# Patient Record
Sex: Female | Born: 1945
Health system: Southern US, Community
[De-identification: ages and names within clinical notes are randomized; demographics above are authoritative.]

## PROBLEM LIST (undated history)

## (undated) DIAGNOSIS — I5189 Other ill-defined heart diseases: Secondary | ICD-10-CM

## (undated) DIAGNOSIS — R911 Solitary pulmonary nodule: Secondary | ICD-10-CM

## (undated) DIAGNOSIS — I671 Cerebral aneurysm, nonruptured: Secondary | ICD-10-CM

## (undated) DIAGNOSIS — E538 Deficiency of other specified B group vitamins: Secondary | ICD-10-CM

## (undated) DIAGNOSIS — E785 Hyperlipidemia, unspecified: Secondary | ICD-10-CM

## (undated) DIAGNOSIS — I340 Nonrheumatic mitral (valve) insufficiency: Secondary | ICD-10-CM

## (undated) DIAGNOSIS — I10 Essential (primary) hypertension: Secondary | ICD-10-CM

## (undated) DIAGNOSIS — C4490 Unspecified malignant neoplasm of skin, unspecified: Secondary | ICD-10-CM

## (undated) DIAGNOSIS — I639 Cerebral infarction, unspecified: Secondary | ICD-10-CM

## (undated) DIAGNOSIS — I712 Thoracic aortic aneurysm, without rupture, unspecified: Secondary | ICD-10-CM

## (undated) DIAGNOSIS — I700 Atherosclerosis of aorta: Secondary | ICD-10-CM

## (undated) DIAGNOSIS — I351 Nonrheumatic aortic (valve) insufficiency: Secondary | ICD-10-CM

## (undated) DIAGNOSIS — I2510 Atherosclerotic heart disease of native coronary artery without angina pectoris: Secondary | ICD-10-CM

## (undated) DIAGNOSIS — E559 Vitamin D deficiency, unspecified: Secondary | ICD-10-CM

## (undated) DIAGNOSIS — N390 Urinary tract infection, site not specified: Secondary | ICD-10-CM

## (undated) DIAGNOSIS — C801 Malignant (primary) neoplasm, unspecified: Secondary | ICD-10-CM

## (undated) DIAGNOSIS — M810 Age-related osteoporosis without current pathological fracture: Secondary | ICD-10-CM

## (undated) DIAGNOSIS — I619 Nontraumatic intracerebral hemorrhage, unspecified: Secondary | ICD-10-CM

## (undated) HISTORY — PX: CHOLECYSTECTOMY: SHX55

## (undated) HISTORY — PX: COLONOSCOPY: SHX174

## (undated) HISTORY — PX: BREAST EXCISIONAL BIOPSY: SUR124

## (undated) HISTORY — PX: ABDOMINAL HYSTERECTOMY: SHX81

---

## 2005-09-21 ENCOUNTER — Ambulatory Visit: Payer: Self-pay

## 2005-09-23 ENCOUNTER — Ambulatory Visit: Payer: Self-pay

## 2006-09-21 ENCOUNTER — Ambulatory Visit: Payer: Self-pay

## 2007-10-03 ENCOUNTER — Ambulatory Visit: Payer: Self-pay

## 2007-10-06 ENCOUNTER — Other Ambulatory Visit: Payer: Self-pay

## 2007-10-06 ENCOUNTER — Ambulatory Visit: Payer: Self-pay | Admitting: Surgery

## 2007-10-12 ENCOUNTER — Ambulatory Visit: Payer: Self-pay | Admitting: Surgery

## 2007-10-12 HISTORY — PX: BREAST BIOPSY: SHX20

## 2008-09-03 ENCOUNTER — Ambulatory Visit: Payer: Self-pay | Admitting: Internal Medicine

## 2008-12-03 ENCOUNTER — Ambulatory Visit: Payer: Self-pay

## 2010-01-21 ENCOUNTER — Ambulatory Visit: Payer: Self-pay

## 2011-01-29 ENCOUNTER — Ambulatory Visit: Payer: Self-pay

## 2011-02-17 ENCOUNTER — Ambulatory Visit: Payer: Self-pay

## 2012-11-14 ENCOUNTER — Ambulatory Visit: Payer: Self-pay | Admitting: Family Medicine

## 2012-12-18 ENCOUNTER — Ambulatory Visit: Payer: Self-pay | Admitting: Gastroenterology

## 2014-01-03 ENCOUNTER — Ambulatory Visit: Payer: Self-pay | Admitting: Family Medicine

## 2014-04-27 ENCOUNTER — Ambulatory Visit: Payer: Self-pay | Admitting: Physician Assistant

## 2014-04-27 LAB — URINALYSIS, COMPLETE
Bilirubin,UR: NEGATIVE
GLUCOSE, UR: NEGATIVE
Ketone: NEGATIVE
NITRITE: NEGATIVE
PH: 7.5 (ref 5.0–8.0)
Protein: NEGATIVE
SPECIFIC GRAVITY: 1.01 (ref 1.000–1.030)
WBC UR: >30 /HPF (ref 0–5)

## 2014-04-28 LAB — URINE CULTURE

## 2014-12-10 ENCOUNTER — Other Ambulatory Visit: Payer: Self-pay | Admitting: Family Medicine

## 2014-12-10 DIAGNOSIS — Z1231 Encounter for screening mammogram for malignant neoplasm of breast: Secondary | ICD-10-CM

## 2015-01-07 ENCOUNTER — Ambulatory Visit
Admission: RE | Admit: 2015-01-07 | Discharge: 2015-01-07 | Disposition: A | Discharge status: Home / Self Care | Payer: PPO | Source: Ambulatory Visit | Attending: Family Medicine | Admitting: Family Medicine

## 2015-01-07 DIAGNOSIS — Z1231 Encounter for screening mammogram for malignant neoplasm of breast: Secondary | ICD-10-CM | POA: Diagnosis present

## 2015-01-07 HISTORY — DX: Malignant (primary) neoplasm, unspecified: C80.1

## 2015-07-15 DIAGNOSIS — N390 Urinary tract infection, site not specified: Secondary | ICD-10-CM | POA: Diagnosis not present

## 2015-07-15 DIAGNOSIS — R300 Dysuria: Secondary | ICD-10-CM | POA: Diagnosis not present

## 2015-12-12 DIAGNOSIS — R030 Elevated blood-pressure reading, without diagnosis of hypertension: Secondary | ICD-10-CM | POA: Diagnosis not present

## 2015-12-12 DIAGNOSIS — Z0000 Encounter for general adult medical examination without abnormal findings: Secondary | ICD-10-CM | POA: Diagnosis not present

## 2015-12-15 ENCOUNTER — Other Ambulatory Visit: Payer: Self-pay | Admitting: Family Medicine

## 2015-12-15 DIAGNOSIS — Z1231 Encounter for screening mammogram for malignant neoplasm of breast: Secondary | ICD-10-CM

## 2016-01-08 ENCOUNTER — Ambulatory Visit
Admission: RE | Admit: 2016-01-08 | Discharge: 2016-01-08 | Disposition: A | Discharge status: Home / Self Care | Payer: PPO | Source: Ambulatory Visit | Attending: Family Medicine | Admitting: Family Medicine

## 2016-01-08 DIAGNOSIS — Z1231 Encounter for screening mammogram for malignant neoplasm of breast: Secondary | ICD-10-CM | POA: Diagnosis not present

## 2016-12-14 ENCOUNTER — Other Ambulatory Visit: Payer: Self-pay | Admitting: Family Medicine

## 2016-12-14 DIAGNOSIS — Z79899 Other long term (current) drug therapy: Secondary | ICD-10-CM | POA: Diagnosis not present

## 2016-12-14 DIAGNOSIS — Z1231 Encounter for screening mammogram for malignant neoplasm of breast: Secondary | ICD-10-CM

## 2016-12-14 DIAGNOSIS — Z23 Encounter for immunization: Secondary | ICD-10-CM | POA: Diagnosis not present

## 2016-12-14 DIAGNOSIS — E785 Hyperlipidemia, unspecified: Secondary | ICD-10-CM | POA: Diagnosis not present

## 2016-12-14 DIAGNOSIS — Z1159 Encounter for screening for other viral diseases: Secondary | ICD-10-CM | POA: Diagnosis not present

## 2016-12-14 DIAGNOSIS — Z0000 Encounter for general adult medical examination without abnormal findings: Secondary | ICD-10-CM | POA: Diagnosis not present

## 2017-01-10 ENCOUNTER — Ambulatory Visit: Payer: PPO

## 2017-02-03 ENCOUNTER — Ambulatory Visit
Admission: RE | Admit: 2017-02-03 | Discharge: 2017-02-03 | Disposition: A | Discharge status: Home / Self Care | Payer: PPO | Source: Ambulatory Visit | Attending: Family Medicine | Admitting: Family Medicine

## 2017-02-03 DIAGNOSIS — Z1231 Encounter for screening mammogram for malignant neoplasm of breast: Secondary | ICD-10-CM | POA: Insufficient documentation

## 2018-04-04 ENCOUNTER — Other Ambulatory Visit: Payer: Self-pay | Admitting: Gerontology

## 2018-04-04 DIAGNOSIS — Z1231 Encounter for screening mammogram for malignant neoplasm of breast: Secondary | ICD-10-CM

## 2018-04-04 DIAGNOSIS — E785 Hyperlipidemia, unspecified: Secondary | ICD-10-CM | POA: Diagnosis not present

## 2018-04-04 DIAGNOSIS — Z0000 Encounter for general adult medical examination without abnormal findings: Secondary | ICD-10-CM | POA: Diagnosis not present

## 2018-04-04 DIAGNOSIS — R030 Elevated blood-pressure reading, without diagnosis of hypertension: Secondary | ICD-10-CM | POA: Diagnosis not present

## 2018-04-04 DIAGNOSIS — E538 Deficiency of other specified B group vitamins: Secondary | ICD-10-CM | POA: Diagnosis not present

## 2018-04-13 ENCOUNTER — Other Ambulatory Visit: Payer: Self-pay | Admitting: Gerontology

## 2018-04-13 DIAGNOSIS — Z1231 Encounter for screening mammogram for malignant neoplasm of breast: Secondary | ICD-10-CM

## 2018-04-19 ENCOUNTER — Ambulatory Visit
Admission: RE | Admit: 2018-04-19 | Discharge: 2018-04-19 | Disposition: A | Discharge status: Home / Self Care | Payer: PPO | Source: Ambulatory Visit | Attending: Family Medicine | Admitting: Family Medicine

## 2018-04-19 ENCOUNTER — Other Ambulatory Visit: Payer: Self-pay

## 2018-04-19 DIAGNOSIS — Z1231 Encounter for screening mammogram for malignant neoplasm of breast: Secondary | ICD-10-CM | POA: Diagnosis not present

## 2018-09-27 DIAGNOSIS — M810 Age-related osteoporosis without current pathological fracture: Secondary | ICD-10-CM | POA: Diagnosis not present

## 2018-10-04 DIAGNOSIS — E785 Hyperlipidemia, unspecified: Secondary | ICD-10-CM | POA: Diagnosis not present

## 2018-10-04 DIAGNOSIS — R030 Elevated blood-pressure reading, without diagnosis of hypertension: Secondary | ICD-10-CM | POA: Diagnosis not present

## 2018-10-04 DIAGNOSIS — E559 Vitamin D deficiency, unspecified: Secondary | ICD-10-CM | POA: Diagnosis not present

## 2018-10-04 DIAGNOSIS — Z1329 Encounter for screening for other suspected endocrine disorder: Secondary | ICD-10-CM | POA: Diagnosis not present

## 2018-10-04 DIAGNOSIS — E538 Deficiency of other specified B group vitamins: Secondary | ICD-10-CM | POA: Diagnosis not present

## 2018-10-04 DIAGNOSIS — M810 Age-related osteoporosis without current pathological fracture: Secondary | ICD-10-CM | POA: Diagnosis not present

## 2018-10-04 DIAGNOSIS — Z0000 Encounter for general adult medical examination without abnormal findings: Secondary | ICD-10-CM | POA: Diagnosis not present

## 2018-10-04 DIAGNOSIS — Z131 Encounter for screening for diabetes mellitus: Secondary | ICD-10-CM | POA: Diagnosis not present

## 2018-10-04 DIAGNOSIS — Z7189 Other specified counseling: Secondary | ICD-10-CM | POA: Diagnosis not present

## 2019-04-03 ENCOUNTER — Other Ambulatory Visit: Payer: Self-pay | Admitting: Gerontology

## 2019-04-03 DIAGNOSIS — Z1239 Encounter for other screening for malignant neoplasm of breast: Secondary | ICD-10-CM | POA: Diagnosis not present

## 2019-04-03 DIAGNOSIS — M810 Age-related osteoporosis without current pathological fracture: Secondary | ICD-10-CM | POA: Diagnosis not present

## 2019-04-03 DIAGNOSIS — R030 Elevated blood-pressure reading, without diagnosis of hypertension: Secondary | ICD-10-CM | POA: Diagnosis not present

## 2019-04-03 DIAGNOSIS — Z79899 Other long term (current) drug therapy: Secondary | ICD-10-CM | POA: Diagnosis not present

## 2019-04-03 DIAGNOSIS — E559 Vitamin D deficiency, unspecified: Secondary | ICD-10-CM | POA: Diagnosis not present

## 2019-04-03 DIAGNOSIS — Z7189 Other specified counseling: Secondary | ICD-10-CM | POA: Diagnosis not present

## 2019-04-03 DIAGNOSIS — E538 Deficiency of other specified B group vitamins: Secondary | ICD-10-CM | POA: Diagnosis not present

## 2019-04-03 DIAGNOSIS — E785 Hyperlipidemia, unspecified: Secondary | ICD-10-CM | POA: Diagnosis not present

## 2019-04-03 DIAGNOSIS — Z1231 Encounter for screening mammogram for malignant neoplasm of breast: Secondary | ICD-10-CM

## 2019-06-05 ENCOUNTER — Other Ambulatory Visit: Payer: Self-pay

## 2019-06-05 ENCOUNTER — Ambulatory Visit
Admission: RE | Admit: 2019-06-05 | Discharge: 2019-06-05 | Disposition: A | Discharge status: Home / Self Care | Payer: PPO | Source: Ambulatory Visit | Attending: Gerontology | Admitting: Gerontology

## 2019-06-05 DIAGNOSIS — Z1231 Encounter for screening mammogram for malignant neoplasm of breast: Secondary | ICD-10-CM | POA: Insufficient documentation

## 2019-10-02 DIAGNOSIS — R399 Unspecified symptoms and signs involving the genitourinary system: Secondary | ICD-10-CM | POA: Diagnosis not present

## 2019-10-03 ENCOUNTER — Ambulatory Visit
Admission: EM | Admit: 2019-10-03 | Discharge: 2019-10-03 | Disposition: A | Discharge status: Home / Self Care | Payer: PPO | Attending: Emergency Medicine | Admitting: Emergency Medicine

## 2019-10-03 ENCOUNTER — Other Ambulatory Visit: Payer: Self-pay

## 2019-10-03 DIAGNOSIS — N390 Urinary tract infection, site not specified: Secondary | ICD-10-CM

## 2019-10-03 DIAGNOSIS — R319 Hematuria, unspecified: Secondary | ICD-10-CM

## 2019-10-03 LAB — URINALYSIS, COMPLETE (UACMP) WITH MICROSCOPIC
Bilirubin Urine: NEGATIVE
Glucose, UA: NEGATIVE mg/dL
Ketones, ur: NEGATIVE mg/dL
Nitrite: NEGATIVE
Protein, ur: >300 mg/dL — AB
RBC / HPF: >50 RBC/hpf (ref 0–5)
Specific Gravity, Urine: 1.015 (ref 1.005–1.030)
WBC, UA: >50 WBC/hpf (ref 0–5)
pH: 5.5 (ref 5.0–8.0)

## 2019-10-03 MED ORDER — PHENAZOPYRIDINE HCL 200 MG PO TABS: 200.0000 mg | ORAL_TABLET | Freq: Two times a day (BID) | ORAL | 0 refills | Status: DC | PRN

## 2019-10-03 MED ORDER — CEPHALEXIN 500 MG PO CAPS: 500.0000 mg | ORAL_CAPSULE | Freq: Two times a day (BID) | ORAL | 0 refills | Status: DC

## 2019-10-03 NOTE — Discharge Instructions (Addendum)
Take the Pyridium as needed for symptom relief. Continue pushing fluids. Finish the Keflex, even if you feel better. We will call you if we need to change your antibiotics.

## 2019-10-03 NOTE — ED Provider Notes (Signed)
HPI  SUBJECTIVE:  Paige Griffin is a 74 y.o. female who presents with 2 days of dysuria, urgency, frequency, cloudy urine. No odorous urine, hematuria. No vomiting, fevers, back, abdominal, pelvic pain. No vaginal bleeding, odor, discharge, rash. States that she has not been sexually active in years. No antibiotics in the past month. No antipyretic in the past 6 hours. She tried pushing water without improvement in her symptoms. Symptoms worse with urination. She has a past medical history of UTIs and states this feels similar to that. No history of pyelonephritis, nephrolithiasis. Also history of hypertension. No history of diabetes, chronic kidney disease. SWN:IOEVO, Larene Beach, NP     Past Medical History:  Diagnosis Date  . Cancer ([place])    skin ca    Past Surgical History:  Procedure Laterality Date  . ABDOMINAL HYSTERECTOMY    . BREAST BIOPSY Right 10/12/07   neg    Family History  Problem Relation Age of Onset  . Breast cancer Other 67    Social History   Tobacco Use  . Smoking status: Never Smoker  . Smokeless tobacco: Never Used  Substance Use Topics  . Alcohol use: Never  . Drug use: Not on file    No current facility-administered medications for this encounter.  Current Outpatient Medications:  .  alendronate (FOSAMAX) 70 MG tablet, Take 70 mg by mouth once a week., Disp: , Rfl:  .  Cholecalciferol 50 MCG (2000 UT) CAPS, Take by mouth., Disp: , Rfl:  .  cyanocobalamin 1000 MCG tablet, Take 2 tablets daily for 2 weeks, then reduce to 1 tablet daily thereafter for Vitamin B12 Deficiency., Disp: , Rfl:  .  losartan (COZAAR) 25 MG tablet, Take 25 mg by mouth daily., Disp: , Rfl:  .  pravastatin (PRAVACHOL) 20 MG tablet, Take 20 mg by mouth at bedtime., Disp: , Rfl:  .  cephALEXin (KEFLEX) 500 MG capsule, Take 1 capsule (500 mg total) by mouth 2 (two) times daily., Disp: 14 capsule, Rfl: 0 .  phenazopyridine (PYRIDIUM) 200 MG tablet, Take 1 tablet (200 mg total)  by mouth 2 (two) times daily as needed for pain., Disp: 4 tablet, Rfl: 0  No Known Allergies   ROS  As noted in HPI.   Physical Exam  BP 136/84 (BP Location: Right Arm)   Pulse 74   Temp 97.7 F (36.5 C) (Oral)   Resp 16   Ht 5' 4.5" (1.638 m)   Wt 54.4 kg   SpO2 96%   BMI 20.28 kg/m   Constitutional: Well developed, well nourished, no acute distress Eyes:  EOMI, conjunctiva normal bilaterally HENT: Normocephalic, atraumatic,mucus membranes moist Respiratory: Normal inspiratory effort Cardiovascular: Normal rate GI: nondistended no suprapubic, flank tenderness Back: No CVAT skin: No rash, skin intact Musculoskeletal: no deformities Neurologic: Alert & oriented x 3, no focal neuro deficits Psychiatric: Speech and behavior appropriate   ED Course   Medications - No data to display  Orders Placed This Encounter  Procedures  . Urine culture    Standing Status:   Standing    Number of Occurrences:   1  . Urinalysis, Complete w Microscopic    Standing Status:   Standing    Number of Occurrences:   1    Results for orders placed or performed during the hospital encounter of 10/03/19 (from the past 24 hour(s))  Urinalysis, Complete w Microscopic Urine, Clean Catch     Status: Abnormal   Collection Time: 10/03/19  9:53 AM  Result  Value Ref Range   Color, Urine YELLOW YELLOW   APPearance CLOUDY (A) CLEAR   Specific Gravity, Urine 1.015 1.005 - 1.030   pH 5.5 5.0 - 8.0   Glucose, UA NEGATIVE NEGATIVE mg/dL   Hgb urine dipstick LARGE (A) NEGATIVE   Bilirubin Urine NEGATIVE NEGATIVE   Ketones, ur NEGATIVE NEGATIVE mg/dL   Protein, ur >300 (A) NEGATIVE mg/dL   Nitrite NEGATIVE NEGATIVE   Leukocytes,Ua SMALL (A) NEGATIVE   Squamous Epithelial / LPF 6-10 0 - 5   WBC, UA >50 0 - 5 WBC/hpf   RBC / HPF >50 0 - 5 RBC/hpf   Bacteria, UA FEW (A) NONE SEEN   No results found.  ED Clinical Impression  1. Urinary tract infection with hematuria, site unspecified       ED Assessment/Plan  Calculated creatinine clearance 55  Outside labs, records reviewed. She has a urine culture ordered yesterday. UA showed trace blood, moderate esterase, few white blood cells and bacteria.  Patient absolutely denies any vaginal complaints. Will treat as UTI with renally dosed Pyridium. No need to renally dose Keflex. Urine culture sent.  Discussed labs,MDM, treatment plan, and plan for follow-up with patient. Discussed sn/sx that should prompt return to the ED. patient agrees with plan.   Meds ordered this encounter  Medications  . phenazopyridine (PYRIDIUM) 200 MG tablet    Sig: Take 1 tablet (200 mg total) by mouth 2 (two) times daily as needed for pain.    Dispense:  4 tablet    Refill:  0  . cephALEXin (KEFLEX) 500 MG capsule    Sig: Take 1 capsule (500 mg total) by mouth 2 (two) times daily.    Dispense:  14 capsule    Refill:  0    *This clinic note was created using [employer]. Therefore, there may be occasional mistakes despite careful proofreading.   ?    Melynda Ripple, MD 10/03/19 1041

## 2019-10-03 NOTE — ED Triage Notes (Signed)
Patient in today w/ c/o UTI sx. Patient states sx onset was Sunday night. Patient states burning and decreased urine output but still has urgency. Patient denies flank pain.

## 2019-10-04 DIAGNOSIS — Z131 Encounter for screening for diabetes mellitus: Secondary | ICD-10-CM | POA: Diagnosis not present

## 2019-10-04 DIAGNOSIS — Z0000 Encounter for general adult medical examination without abnormal findings: Secondary | ICD-10-CM | POA: Diagnosis not present

## 2019-10-04 DIAGNOSIS — E785 Hyperlipidemia, unspecified: Secondary | ICD-10-CM | POA: Diagnosis not present

## 2019-10-04 DIAGNOSIS — R030 Elevated blood-pressure reading, without diagnosis of hypertension: Secondary | ICD-10-CM | POA: Diagnosis not present

## 2019-10-04 DIAGNOSIS — E538 Deficiency of other specified B group vitamins: Secondary | ICD-10-CM | POA: Diagnosis not present

## 2019-10-04 DIAGNOSIS — Z1329 Encounter for screening for other suspected endocrine disorder: Secondary | ICD-10-CM | POA: Diagnosis not present

## 2019-10-04 DIAGNOSIS — M810 Age-related osteoporosis without current pathological fracture: Secondary | ICD-10-CM | POA: Diagnosis not present

## 2019-10-04 DIAGNOSIS — E559 Vitamin D deficiency, unspecified: Secondary | ICD-10-CM | POA: Diagnosis not present

## 2019-10-05 ENCOUNTER — Telehealth: Payer: Self-pay

## 2019-10-05 LAB — URINE CULTURE: Culture: >=100000 — AB

## 2019-10-05 MED ORDER — SULFAMETHOXAZOLE-TRIMETHOPRIM 800-160 MG PO TABS: 1.0000 | ORAL_TABLET | Freq: Two times a day (BID) | ORAL | 0 refills | Status: AC

## 2019-12-24 IMAGING — MG DIGITAL SCREENING BILATERAL MAMMOGRAM WITH TOMO AND CAD
8 series · 9 of 24 positions shown · non-contrast
Comparison: Previous exam(s).

CLINICAL DATA: Screening.

EXAM:
DIGITAL SCREENING BILATERAL MAMMOGRAM WITH TOMO AND CAD

[L CC synth-2D]
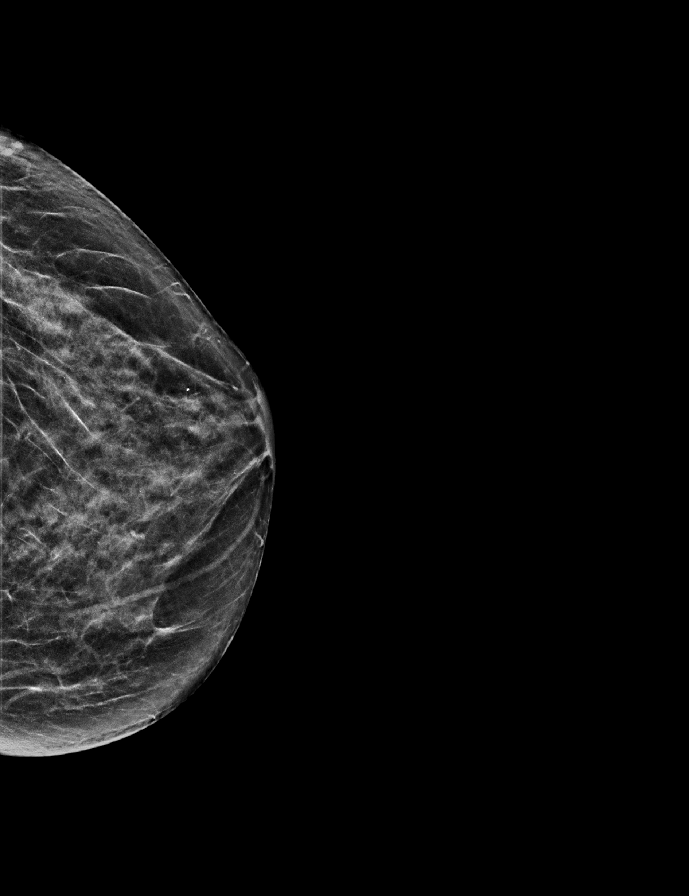

[R CC synth-2D]
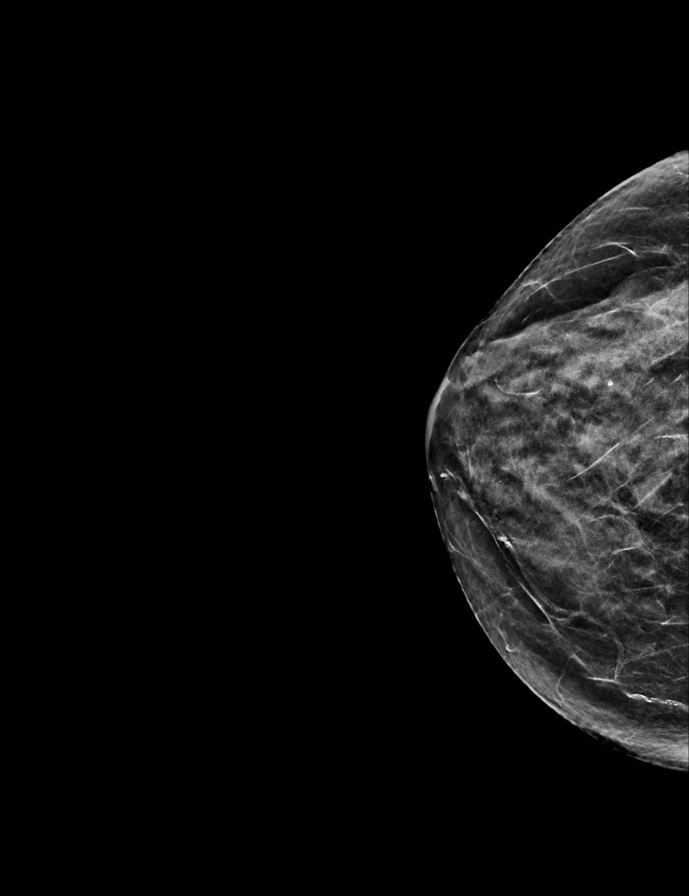

[R MLO synth-2D]
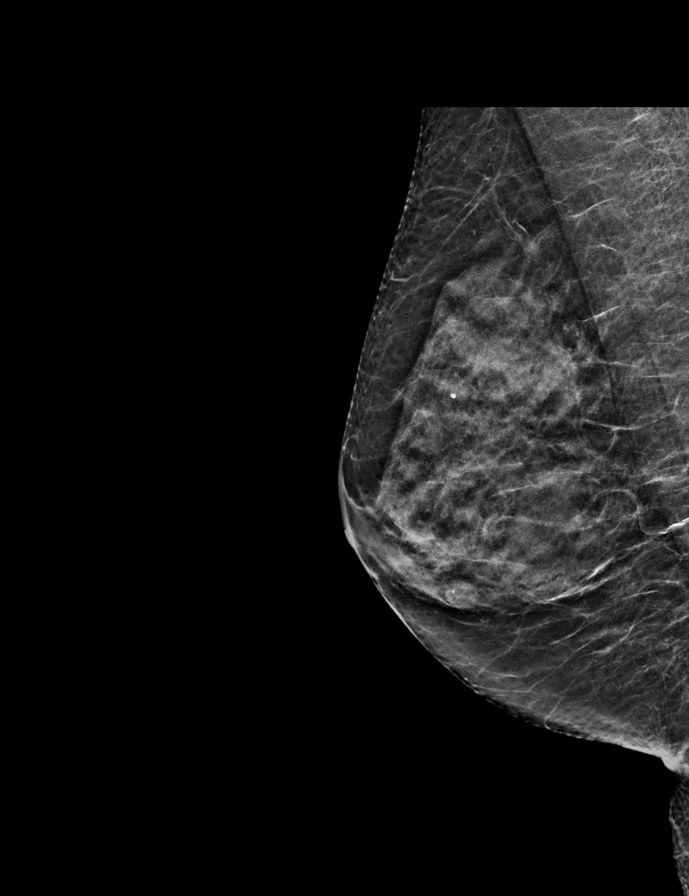

[L MLO synth-2D]
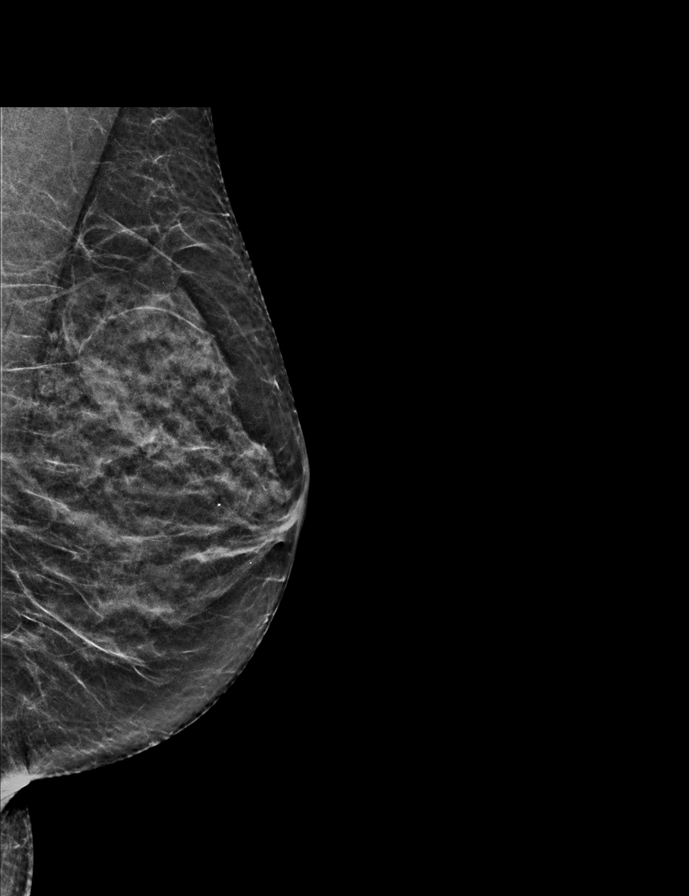

[L CC tomo · 2 of 47 frames shown]
[frame 16/47]
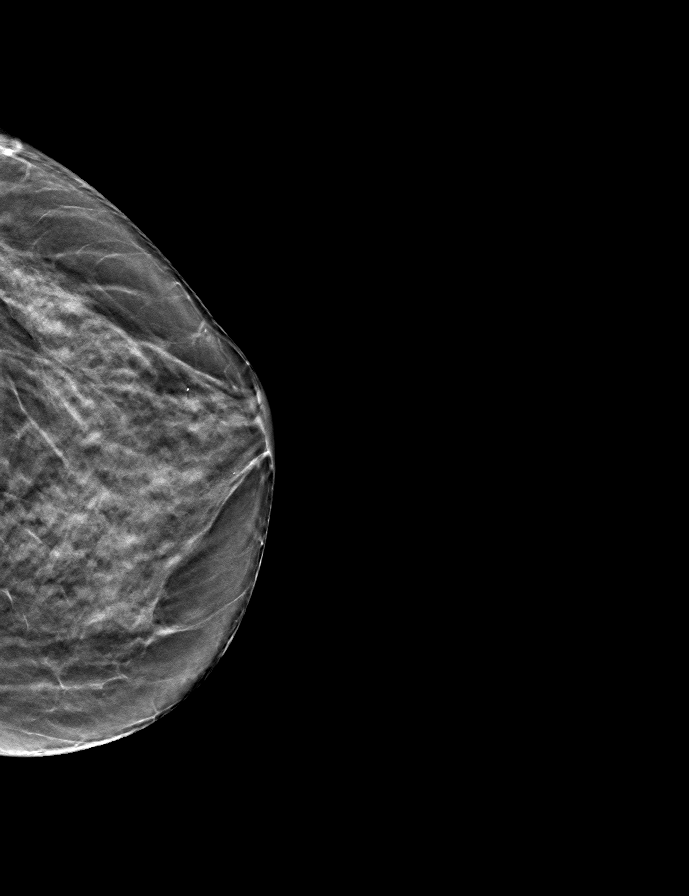
[frame 24/47]
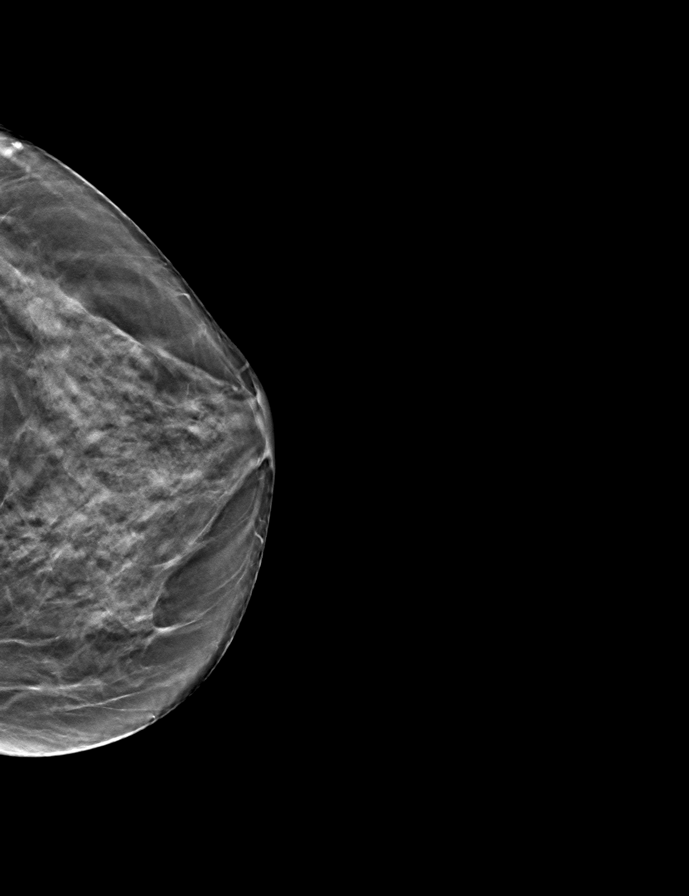

[L MLO tomo · tomo slice 22/43.0]
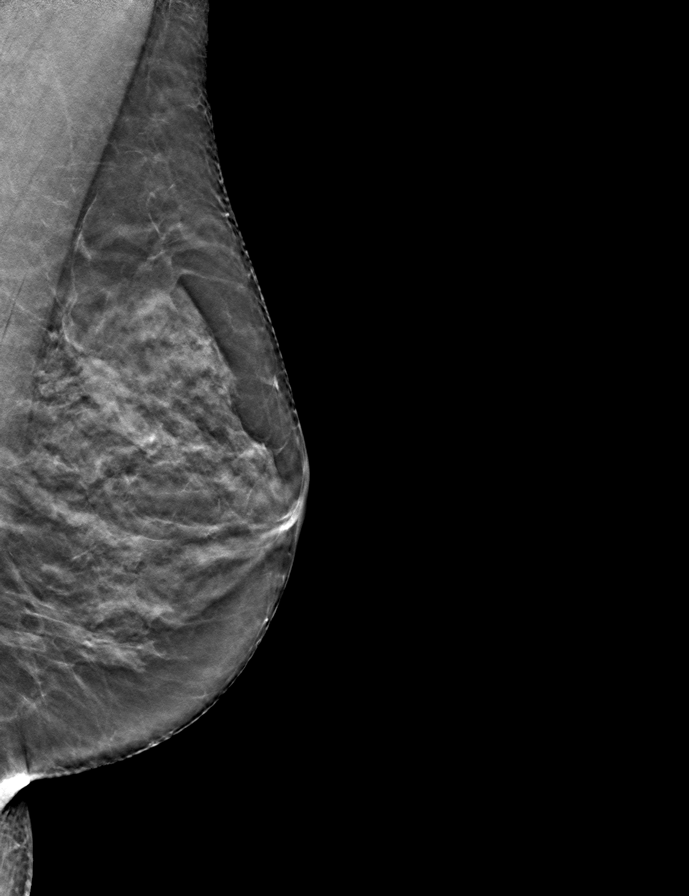

[R CC tomo · tomo slice 25/48.0]
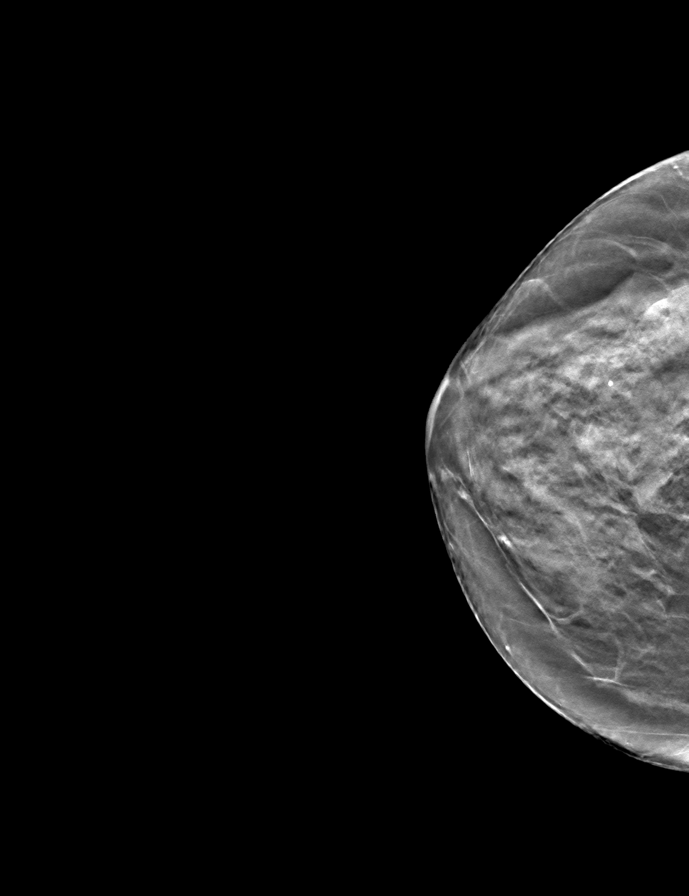

[R MLO tomo · tomo slice 21/42.0]
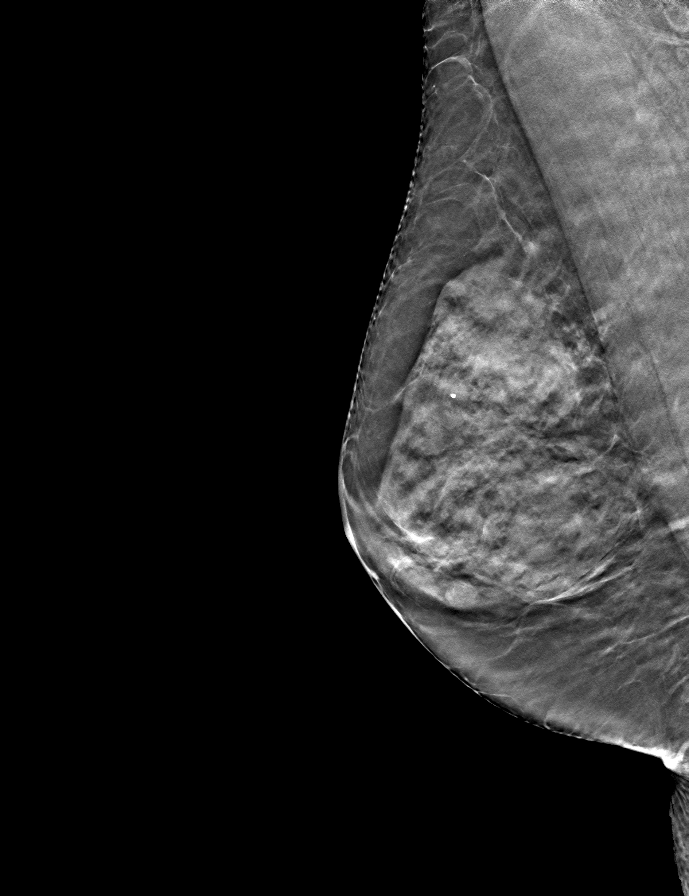

[9 of 24 positions shown; findings below may reference images not displayed]

ACR Breast Density Category c: The breast tissue is heterogeneously
dense, which may obscure small masses.
FINDINGS: There are no findings suspicious for malignancy. Images were
processed with CAD.
IMPRESSION: No mammographic evidence of malignancy. A result letter of this
screening mammogram will be mailed directly to the patient.

RECOMMENDATION:
Screening mammogram in one year. (Code:FT-U-LHB)

BI-RADS CATEGORY  1: Negative.

## 2020-05-26 DIAGNOSIS — R030 Elevated blood-pressure reading, without diagnosis of hypertension: Secondary | ICD-10-CM | POA: Diagnosis not present

## 2020-05-26 DIAGNOSIS — Z1231 Encounter for screening mammogram for malignant neoplasm of breast: Secondary | ICD-10-CM | POA: Diagnosis not present

## 2020-05-26 DIAGNOSIS — E538 Deficiency of other specified B group vitamins: Secondary | ICD-10-CM | POA: Diagnosis not present

## 2020-05-26 DIAGNOSIS — E559 Vitamin D deficiency, unspecified: Secondary | ICD-10-CM | POA: Diagnosis not present

## 2020-05-26 DIAGNOSIS — M810 Age-related osteoporosis without current pathological fracture: Secondary | ICD-10-CM | POA: Diagnosis not present

## 2020-05-26 DIAGNOSIS — E785 Hyperlipidemia, unspecified: Secondary | ICD-10-CM | POA: Diagnosis not present

## 2020-05-27 ENCOUNTER — Other Ambulatory Visit: Payer: Self-pay | Admitting: Gerontology

## 2020-05-27 DIAGNOSIS — Z1231 Encounter for screening mammogram for malignant neoplasm of breast: Secondary | ICD-10-CM

## 2020-06-05 ENCOUNTER — Ambulatory Visit
Admission: RE | Admit: 2020-06-05 | Discharge: 2020-06-05 | Disposition: A | Discharge status: Home / Self Care | Payer: PPO | Source: Ambulatory Visit | Attending: Gerontology | Admitting: Gerontology

## 2020-06-05 ENCOUNTER — Other Ambulatory Visit: Payer: Self-pay

## 2020-06-05 DIAGNOSIS — Z1231 Encounter for screening mammogram for malignant neoplasm of breast: Secondary | ICD-10-CM | POA: Diagnosis not present

## 2020-12-31 DIAGNOSIS — I10 Essential (primary) hypertension: Secondary | ICD-10-CM | POA: Diagnosis not present

## 2020-12-31 DIAGNOSIS — E785 Hyperlipidemia, unspecified: Secondary | ICD-10-CM | POA: Diagnosis not present

## 2020-12-31 DIAGNOSIS — E538 Deficiency of other specified B group vitamins: Secondary | ICD-10-CM | POA: Diagnosis not present

## 2020-12-31 DIAGNOSIS — Z0000 Encounter for general adult medical examination without abnormal findings: Secondary | ICD-10-CM | POA: Diagnosis not present

## 2020-12-31 DIAGNOSIS — Z131 Encounter for screening for diabetes mellitus: Secondary | ICD-10-CM | POA: Diagnosis not present

## 2020-12-31 DIAGNOSIS — Z23 Encounter for immunization: Secondary | ICD-10-CM | POA: Diagnosis not present

## 2020-12-31 DIAGNOSIS — M810 Age-related osteoporosis without current pathological fracture: Secondary | ICD-10-CM | POA: Diagnosis not present

## 2020-12-31 DIAGNOSIS — Z1329 Encounter for screening for other suspected endocrine disorder: Secondary | ICD-10-CM | POA: Diagnosis not present

## 2020-12-31 DIAGNOSIS — E559 Vitamin D deficiency, unspecified: Secondary | ICD-10-CM | POA: Diagnosis not present

## 2021-07-02 ENCOUNTER — Other Ambulatory Visit: Payer: Self-pay | Admitting: Gerontology

## 2021-07-02 DIAGNOSIS — Z1231 Encounter for screening mammogram for malignant neoplasm of breast: Secondary | ICD-10-CM

## 2021-07-06 ENCOUNTER — Ambulatory Visit
Admission: RE | Admit: 2021-07-06 | Discharge: 2021-07-06 | Disposition: A | Discharge status: Home / Self Care | Payer: PPO | Source: Ambulatory Visit | Attending: Gerontology | Admitting: Gerontology

## 2021-07-06 DIAGNOSIS — Z1231 Encounter for screening mammogram for malignant neoplasm of breast: Secondary | ICD-10-CM | POA: Insufficient documentation

## 2022-07-22 ENCOUNTER — Other Ambulatory Visit: Payer: Self-pay | Admitting: Gerontology

## 2022-07-22 DIAGNOSIS — Z1231 Encounter for screening mammogram for malignant neoplasm of breast: Secondary | ICD-10-CM

## 2022-08-11 ENCOUNTER — Ambulatory Visit
Admission: RE | Admit: 2022-08-11 | Discharge: 2022-08-11 | Disposition: A | Discharge status: Home / Self Care | Payer: PPO | Source: Ambulatory Visit | Attending: Gerontology | Admitting: Gerontology

## 2022-08-11 DIAGNOSIS — Z1231 Encounter for screening mammogram for malignant neoplasm of breast: Secondary | ICD-10-CM | POA: Diagnosis present

## 2023-01-02 DIAGNOSIS — I619 Nontraumatic intracerebral hemorrhage, unspecified: Secondary | ICD-10-CM

## 2023-01-02 HISTORY — DX: Nontraumatic intracerebral hemorrhage, unspecified: I61.9

## 2023-01-04 ENCOUNTER — Inpatient Hospital Stay: Payer: PPO

## 2023-01-04 ENCOUNTER — Emergency Department: Payer: PPO

## 2023-01-04 ENCOUNTER — Other Ambulatory Visit: Payer: Self-pay

## 2023-01-04 ENCOUNTER — Inpatient Hospital Stay
Admission: EM | Admit: 2023-01-04 | Discharge: 2023-01-06 | DRG: 064 | Disposition: A | Discharge status: Home / Self Care | Payer: PPO | Attending: Internal Medicine | Admitting: Internal Medicine

## 2023-01-04 DIAGNOSIS — E538 Deficiency of other specified B group vitamins: Secondary | ICD-10-CM | POA: Diagnosis present

## 2023-01-04 DIAGNOSIS — I610 Nontraumatic intracerebral hemorrhage in hemisphere, subcortical: Principal | ICD-10-CM | POA: Diagnosis present

## 2023-01-04 DIAGNOSIS — I6389 Other cerebral infarction: Secondary | ICD-10-CM | POA: Diagnosis not present

## 2023-01-04 DIAGNOSIS — S40022A Contusion of left upper arm, initial encounter: Secondary | ICD-10-CM | POA: Diagnosis present

## 2023-01-04 DIAGNOSIS — Z85828 Personal history of other malignant neoplasm of skin: Secondary | ICD-10-CM

## 2023-01-04 DIAGNOSIS — Z79899 Other long term (current) drug therapy: Secondary | ICD-10-CM | POA: Diagnosis not present

## 2023-01-04 DIAGNOSIS — Z803 Family history of malignant neoplasm of breast: Secondary | ICD-10-CM

## 2023-01-04 DIAGNOSIS — R918 Other nonspecific abnormal finding of lung field: Secondary | ICD-10-CM

## 2023-01-04 DIAGNOSIS — Y92002 Bathroom of unspecified non-institutional (private) residence single-family (private) house as the place of occurrence of the external cause: Secondary | ICD-10-CM | POA: Diagnosis not present

## 2023-01-04 DIAGNOSIS — R29810 Facial weakness: Secondary | ICD-10-CM | POA: Diagnosis present

## 2023-01-04 DIAGNOSIS — Z7983 Long term (current) use of bisphosphonates: Secondary | ICD-10-CM

## 2023-01-04 DIAGNOSIS — W1839XA Other fall on same level, initial encounter: Secondary | ICD-10-CM | POA: Diagnosis present

## 2023-01-04 DIAGNOSIS — M810 Age-related osteoporosis without current pathological fracture: Secondary | ICD-10-CM | POA: Diagnosis present

## 2023-01-04 DIAGNOSIS — R911 Solitary pulmonary nodule: Secondary | ICD-10-CM | POA: Diagnosis present

## 2023-01-04 DIAGNOSIS — N390 Urinary tract infection, site not specified: Secondary | ICD-10-CM | POA: Insufficient documentation

## 2023-01-04 DIAGNOSIS — R29704 NIHSS score 4: Secondary | ICD-10-CM | POA: Diagnosis present

## 2023-01-04 DIAGNOSIS — I10 Essential (primary) hypertension: Secondary | ICD-10-CM | POA: Insufficient documentation

## 2023-01-04 DIAGNOSIS — I619 Nontraumatic intracerebral hemorrhage, unspecified: Principal | ICD-10-CM | POA: Diagnosis present

## 2023-01-04 DIAGNOSIS — G8194 Hemiplegia, unspecified affecting left nondominant side: Secondary | ICD-10-CM | POA: Diagnosis present

## 2023-01-04 DIAGNOSIS — G936 Cerebral edema: Secondary | ICD-10-CM | POA: Diagnosis present

## 2023-01-04 DIAGNOSIS — Y93E8 Activity, other personal hygiene: Secondary | ICD-10-CM | POA: Diagnosis not present

## 2023-01-04 DIAGNOSIS — E785 Hyperlipidemia, unspecified: Secondary | ICD-10-CM | POA: Diagnosis present

## 2023-01-04 DIAGNOSIS — Z90710 Acquired absence of both cervix and uterus: Secondary | ICD-10-CM | POA: Diagnosis not present

## 2023-01-04 LAB — CBC
HCT: 39.3 % (ref 36.0–46.0)
Hemoglobin: 13.8 g/dL (ref 12.0–15.0)
MCH: 30.6 pg (ref 26.0–34.0)
MCHC: 35.1 g/dL (ref 30.0–36.0)
MCV: 87.1 fL (ref 80.0–100.0)
Platelets: 237 10*3/uL (ref 150–400)
RBC: 4.51 MIL/uL (ref 3.87–5.11)
RDW: 12.1 % (ref 11.5–15.5)
WBC: 9.3 10*3/uL (ref 4.0–10.5)
nRBC: 0 % (ref 0.0–0.2)

## 2023-01-04 LAB — BASIC METABOLIC PANEL
Anion gap: 9 (ref 5–15)
BUN: 10 mg/dL (ref 8–23)
CO2: 23 mmol/L (ref 22–32)
Calcium: 9 mg/dL (ref 8.9–10.3)
Chloride: 104 mmol/L (ref 98–111)
Creatinine, Ser: 0.7 mg/dL (ref 0.44–1.00)
GFR, Estimated: >60 mL/min (ref >60)
Glucose, Bld: 134 mg/dL — ABNORMAL HIGH (ref 70–99)
Potassium: 3.7 mmol/L (ref 3.5–5.1)
Sodium: 136 mmol/L (ref 135–145)

## 2023-01-04 LAB — MRSA NEXT GEN BY PCR, NASAL: MRSA by PCR Next Gen: NOT DETECTED

## 2023-01-04 MED ORDER — STROKE: EARLY STAGES OF RECOVERY BOOK: Freq: Once | Status: AC

## 2023-01-04 MED ORDER — ACETAMINOPHEN 650 MG RE SUPP: 650.0000 mg | RECTAL | Status: DC | PRN

## 2023-01-04 MED ORDER — ACETAMINOPHEN 160 MG/5ML PO SOLN: 650.0000 mg | ORAL | Status: DC | PRN

## 2023-01-04 MED ORDER — LABETALOL HCL 5 MG/ML IV SOLN: 10.0000 mg | Freq: Once | INTRAVENOUS | Status: DC

## 2023-01-04 MED ORDER — ORAL CARE MOUTH RINSE: 15.0000 mL | OROMUCOSAL | Status: DC | PRN

## 2023-01-04 MED ORDER — IOHEXOL 350 MG/ML SOLN
75.0000 mL | Freq: Once | INTRAVENOUS | Status: AC | PRN
  Administered 2023-01-04: 75 mL via INTRAVENOUS

## 2023-01-04 MED ORDER — CHLORHEXIDINE GLUCONATE CLOTH 2 % EX PADS
6.0000 | MEDICATED_PAD | Freq: Every day | CUTANEOUS | Status: DC
  Administered 2023-01-04: 6 via TOPICAL

## 2023-01-04 MED ORDER — ACETAMINOPHEN 325 MG PO TABS: 650.0000 mg | ORAL_TABLET | ORAL | Status: DC | PRN

## 2023-01-04 MED ORDER — SENNOSIDES-DOCUSATE SODIUM 8.6-50 MG PO TABS
1.0000 | ORAL_TABLET | Freq: Two times a day (BID) | ORAL | Status: DC
Start: 2023-01-04 — End: 2023-01-06
  Administered 2023-01-04 – 2023-01-06 (×3): 1 via ORAL
  Filled 2023-01-04 (×4): qty 1

## 2023-01-04 MED ORDER — PANTOPRAZOLE SODIUM 40 MG IV SOLR
40.0000 mg | Freq: Every day | INTRAVENOUS | Status: DC
  Administered 2023-01-04 – 2023-01-05 (×2): 40 mg via INTRAVENOUS
  Filled 2023-01-04 (×2): qty 10

## 2023-01-04 MED ORDER — CLEVIDIPINE BUTYRATE 0.5 MG/ML IV EMUL
0.0000 mg/h | INTRAVENOUS | Status: DC
  Administered 2023-01-04 (×3): 2 mg/h via INTRAVENOUS
  Administered 2023-01-04: 6 mg/h via INTRAVENOUS
  Filled 2023-01-04 (×2): qty 50

## 2023-01-04 NOTE — ED Notes (Signed)
Family at bedside. 

## 2023-01-04 NOTE — ED Notes (Signed)
Pump channel error, new channel placed and cleviprex restarted

## 2023-01-04 NOTE — Consult Note (Signed)
NEUROLOGY CONSULTATION NOTE   Date of service: January 04, 2023 Patient Name: Paige Griffin MRN:  [id number] DOB:  06/23/1945 Reason for consult: basal ganglia hemorrhage Requesting physician: Dr. Claudell Kyle _ _ _   _ __   _ __ _ _  __ __   _ __   __ _  History of Present Illness   This is a 77 year old woman with past medical history significant for hypertension who presented to the emergency department after acute onset of left-sided weakness last night.  She was standing at the counter brushing her teeth around 8:30 PM and felt weak on the left side.  The weakness in her left leg then caused her to fall.  She feels weak in her left arm as well although not as much is in her leg.  She has bruising on her left side secondary to the fall.  She is not on any anticoagulation.  Head CT personal review shows small area of hemorrhage in the right basal ganglia.  Her blood pressure is currently in the 180s systolic.   ROS   Per HPI: all other systems reviewed and are negative  Past History   I have reviewed the following:  Past Medical History:  Diagnosis Date   Cancer ([facility])    skin ca   Past Surgical History:  Procedure Laterality Date   ABDOMINAL HYSTERECTOMY     BREAST BIOPSY Right 10/12/2007   neg   BREAST EXCISIONAL BIOPSY Right    Family History  Problem Relation Age of Onset   Breast cancer Other 49   Social History   Socioeconomic History   Marital status: Married    Spouse name: Not on file   Number of children: Not on file   Years of education: Not on file   Highest education level: Not on file  Occupational History   Not on file  Tobacco Use   Smoking status: Never   Smokeless tobacco: Never  Substance and Sexual Activity   Alcohol use: Never   Drug use: Not on file   Sexual activity: Not on file  Other Topics Concern   Not on file  Social History Narrative   Not on file   Social Determinants of Health   Financial Resource Strain: Low Risk   (12/14/2016)   Received from [facility], [facility]   Overall Financial Resource Strain (CARDIA)    Difficulty of Paying Living Expenses: Not hard at all  Food Insecurity: No Food Insecurity (12/14/2016)   Received from [facility], [facility]   Hunger Vital Sign    Worried About Running Out of Food in the Last Year: Never true    Ran Out of Food in the Last Year: Never true  Transportation Needs: No Transportation Needs (12/14/2016)   Received from [facility], [facility]   PRAPARE - Transportation    Lack of Transportation (Medical): No    Lack of Transportation (Non-Medical): No  Physical Activity: Sufficiently Active (12/14/2016)   Received from [facility], [facility]   Exercise Vital Sign    Days of Exercise per Week: 5 days    Minutes of Exercise per Session: 50 min  Stress: No Stress Concern Present (12/14/2016)   Received from [facility], [facility]   [employer] of Occupational Health - Occupational Stress Questionnaire    Feeling of Stress : Not at  all  Social Connections: Socially Integrated (12/14/2016)   Received from [facility], [facility]   Social Connection and Isolation Panel [NHANES]    Frequency of Communication with Friends and Family: More than three times a week    Frequency of Social Gatherings with Friends and Family: More than three times a week    Attends Religious Services: More than 4 times per year    Active Member of [employer] or Organizations: Yes    Attends Engineer, structural: More than 4 times per year    Marital Status: Married   No Known Allergies  Medications   (Not in a hospital admission)     Current Facility-Administered Medications:    clevidipine (CLEVIPREX) infusion 0.5 mg/mL, 0-21 mg/hr,  Intravenous, Continuous, Jefferson Fuel, MD, Last Rate: 14 mL/hr at 01/04/23 1708, 7 mg/hr at 01/04/23 1708  Current Outpatient Medications:    alendronate (FOSAMAX) 70 MG tablet, Take 70 mg by mouth once a week., Disp: , Rfl:    cephALEXin (KEFLEX) 500 MG capsule, Take 1 capsule (500 mg total) by mouth 2 (two) times daily., Disp: 14 capsule, Rfl: 0   Cholecalciferol 50 MCG (2000 UT) CAPS, Take by mouth., Disp: , Rfl:    cyanocobalamin 1000 MCG tablet, Take 2 tablets daily for 2 weeks, then reduce to 1 tablet daily thereafter for Vitamin B12 Deficiency., Disp: , Rfl:    losartan (COZAAR) 25 MG tablet, Take 25 mg by mouth daily., Disp: , Rfl:    phenazopyridine (PYRIDIUM) 200 MG tablet, Take 1 tablet (200 mg total) by mouth 2 (two) times daily as needed for pain., Disp: 4 tablet, Rfl: 0   pravastatin (PRAVACHOL) 20 MG tablet, Take 20 mg by mouth at bedtime., Disp: , Rfl:   Vitals   Vitals:   01/04/23 1704 01/04/23 1706 01/04/23 1710 01/04/23 1715  BP: 121/79 121/76 121/79 122/80  Pulse: 93 91 89 93  Resp: 14 12 11 13   Temp:      TempSrc:      SpO2:      Weight:      Height:         Body mass index is 19.74 kg/m.  Physical Exam   Physical Exam Gen: A&O x4, NAD HEENT: Atraumatic, normocephalic;mucous membranes moist; oropharynx clear, tongue without atrophy or fasciculations. Neck: Supple, trachea midline. Resp: CTAB, no w/r/r CV: RRR, no m/g/r; nml S1 and S2. 2+ symmetric peripheral pulses. Abd: soft/NT/ND; nabs x 4 quad Extrem: Nml bulk; no cyanosis, clubbing, or edema.  Neuro: *MS: A&O x4. Follows multi-step commands.  *Speech: fluid, nondysarthric, able to name and repeat *CN:    I: Deferred   II,III: PERRLA, VFF by confrontation, optic discs unable to be visualized 2/2 pupillary constriction   III,IV,VI: EOMI w/o nystagmus, no ptosis   V: Sensation intact from V1 to V3 to LT   VII: Eyelid closure was full.  L NLF flattening   VIII: Hearing intact to voice   IX,X:  Voice normal, palate elevates symmetrically    XI: SCM/trap 5/5 bilat   XII: Tongue protrudes midline, no atrophy or fasciculations  *Motor:   RUE and RLE no drift. LUE drift but not to bed, LLE drift to bed. *Sensory: Intact to light touch, pinprick, temperature vibration throughout. Symmetric. Propioception intact bilat.  No double-simultaneous extinction.  *Coordination:  FNF intact bilat *Reflexes:  2+ and symmetric throughout without clonus; toes down-going bilat *Gait: deferred  NIHSS = 4 for facial droop,  LUE drift (1), LLE drift (2)  ICH score = 0  Premorbid mRS = 0   Labs   CBC:  Recent Labs  Lab 01/04/23 1305  WBC 9.3  HGB 13.8  HCT 39.3  MCV 87.1  PLT 237    Basic Metabolic Panel:  Lab Results  Component Value Date   NA 136 01/04/2023   K 3.7 01/04/2023   CO2 23 01/04/2023   GLUCOSE 134 (H) 01/04/2023   BUN 10 01/04/2023   CREATININE 0.70 01/04/2023   CALCIUM 9.0 01/04/2023   GFRNONAA >60 01/04/2023   Lipid Panel: No results found for: "LDLCALC" HgbA1c: No results found for: "HGBA1C" Urine Drug Screen: No results found for: "LABOPIA", "COCAINSCRNUR", "LABBENZ", "AMPHETMU", "THCU", "LABBARB"  Alcohol Level No results found for: "ETH"  CT Head without contrast: 1. Acute hemorrhage in the right basal ganglia and thalamus, which measures up to 1.5 cm, with an approximate volume of 2 mL. Surrounding edema without significant mass effect or midline shift. 2. No acute fracture or traumatic listhesis in the cervical spine. 3. Right upper lobe mass, which measures up to 2.0 cm, concerning for malignancy.  CT angio Head and Neck with contrast: 1. No evidence of active extravasation into the right basal ganglia/thalamus hemorrhage. No evidence of vascular malformation. 2. No intracranial large vessel occlusion or significant stenosis. 3. 1-2 mm medially projecting outpouching from the right cavernous ICA may represent a tiny aneurysm. 4. Additional  projections from the bilateral distal supraclinoid ICA are favored to represent infundibulla at the origins of the posterior communicating arteries.   Impression   This is a 77 yo woman with hx hypertension who presents with left sided weakness 2/2 R basal ganglia hemorrhage. Etiology is likely hypertensive. CT head incidentally showed a R upper lobe mass concerning for malignancy, so she should have a chest CT to further evaluate this as well as MRI brain with and without contrast to r/o underlying mass lesion. The possible R cavernous ICA tiny aneurysm is not felt to be related to the hemorrhage.  Recommendations   - Admit to ICU under CCM - Clevidipine for goal SBP 130-150 - SCDs for DVT prophylaxis - Head CT in 6 hrs assess stability of ICH - HOB elevated 30 degrees - No anticoagulation or antiplatelets at this time - Hold oral antihypertensives - Chest CT r/o malignancy - MRI brain wwo tomorrow (this does not take the place of the above CT in 6 hrs) - Frequent neurochecks per protocol - No indication of hydrocephalus or involvement of neurosurgery at this time.   This patient is critically ill and at significant risk of neurological worsening, death and care requires constant monitoring of vital signs, hemodynamics,respiratory and cardiac monitoring, neurological assessment, discussion with family, other specialists and medical decision making of high complexity. I spent 70 minutes of neurocritical care time  in the care of  this patient. This was time spent independent of any time provided by nurse practitioner or PA.   Bing Neighbors, MD [facility] [phone]   If 7pm- 7am, please page neurology on call as listed in AMION.

## 2023-01-04 NOTE — ED Notes (Signed)
ERP notified of pt BP, see orders

## 2023-01-04 NOTE — ED Provider Notes (Signed)
  Physical Exam  BP (!) 133/90   Pulse 89   Temp 98.1 F (36.7 C) (Oral)   Resp 13   Ht 5\' 4"  (1.626 m)   Wt 52.2 kg   SpO2 96%   BMI 19.74 kg/m   Physical Exam  Procedures  Procedures  ED Course / MDM   Clinical Course as of 01/04/23 1610  Tue Jan 04, 2023  1530 Neuro - CTA head -> if no vascular abnormalities, can be admitted to ICU here [DW]    Clinical Course User Index [DW] Janith Lima, MD   Medical Decision Making Amount and/or Complexity of Data Reviewed Labs: ordered. Radiology: ordered.  Risk Prescription drug management. Decision regarding hospitalization.   Received patient in signout.  77 year old female with history of hypertension presenting to the ED for left-sided weakness.  Symptoms started around 8:30 PM last night and resulted in a fall.  Workup prior to sign out with CTA head shows evidence of intraparenchymal hemorrhage in the basal ganglia concerning for hemorrhagic stroke.  Neurology was consulted.  Patient started on Cleviprex.  Signed out pending neurology disposition.  Neurology recommending CTA head to evaluate for any vascular abnormalities.  If this is negative, patient can be admitted to our ICU here.  If positive, likely will need transfer to [facility].  CTA head shows no active extravasation.  1 note of a small 1 to 2 mm outpouching that may represent a tiny aneurysm in the right cavernous ICA.  Discussed with neurology, they recommend admission to our ICU here at [facility].  Admitted to ICU for ongoing care.     Janith Lima, MD 01/04/23 [phone]

## 2023-01-04 NOTE — H&P (Signed)
NAME:  Paige Griffin, MRN:  [id number], DOB:  05-09-1945, LOS: 0 ADMISSION DATE:  01/04/2023, CONSULTATION DATE: 01/04/2023 REFERRING MD: Dr. Anner Crete, CHIEF COMPLAINT: Left-sided weakness    History of Present Illness:  This is a 77 yo female with a PMH of Essential HTN and HLD who presented to [facility] ER via EMS on 12/3 with acute onset of left-sided weakness.  She reports she was standing at the counter brushing her teeth around 2030 when she developed left-sided weakness.  Due to the weakness she fell on the floor and hit her head.  The fall caused bruising of the left arm along with a skin tear and bilateral knees.  She is not on any anticoagulants.  Per ED notes EMS reported pt had a negative stroke screen upon their assessment.    ED Course  Upon arrival to the pt continued to c/o left upper and lower extremity weakness, however worse in the left upper extremity.  Lab results were within normal limits.  CT Head revealed an acute hemorrhage in the right basal ganglia and thalamus.  On call neurologist Dr. Selina Cooley contacted by EDP and recommended obtaining CTA Head to evaluate for vascular abnormalities.  CTA Head negative for vascular malformation or active extravasation, but concerning for 1-2 mm medially projecting outpouching from the right cavernous ICA may represent a tiny aneurysm.  EDP discussed results with on call neurologist Dr. Selina Cooley who recommended admission to [facility] ICU with no indications to transfer to [facility].  Dr. Selina Cooley also recommended starting cleviprex gtt for goal sbp 130-150.  PCCM team contacted for ICU admission.    CT Head/Cervical Spine:  Acute hemorrhage in the right basal ganglia and thalamus, which measures up to 1.5 cm, with an approximate volume of 2 mL. Surrounding edema without significant mass effect or midline shift. No acute fracture or traumatic listhesis in the cervical spine. Right upper lobe mass, which measures up to 2.0 cm, concerning for malignancy.  CTA  Head W OR WO Contrast:  No evidence of active extravasation into the right basal ganglia/thalamus hemorrhage. No evidence of vascular malformation. No intracranial large vessel occlusion or significant stenosis. 1-2 mm medially projecting outpouching from the right cavernous ICA may represent a tiny aneurysm. Additional projections from the bilateral distal supraclinoid ICA are favored to represent infundibulla at the origins of the posterior communicating arteries.  Pertinent  Medical History  Essential HTN  HLD  Vitamin B12 Deficiency  Vitamin D Deficiency  Age-related osteoporosis without pathological fracture   Significant Hospital Events: Including procedures, antibiotic start and stop dates in addition to other pertinent events   12/03: Pt admitted with traumatic right basal ganglia hemorrhage suspect secondary to hypertension requiring cleviprex gtt for goal sbp 130-150   Interim History / Subjective:  Pt resting comfortably no complaints of headache or blurred vision.  Does endorse left upper and lower extremity weakness   Objective   Blood pressure 124/79, pulse 95, temperature 98.1 F (36.7 C), temperature source Oral, resp. rate 12, height 5\' 4"  (1.626 m), weight 52.2 kg, SpO2 97%.        Intake/Output Summary (Last 24 hours) at 01/04/2023 1838 Last data filed at 01/04/2023 1738 Gross per 24 hour  Intake 20.12 ml  Output --  Net 20.12 ml   Filed Weights   01/04/23 1258  Weight: 52.2 kg    Examination: General: Acutely-ill appearing female, NAD resting in bed on RA  HENT: Supple, no JVD  Lungs: Clear throughout, even, non  labored  Cardiovascular: NSR, s1s2, no m/r/g, 2+ radial/2+ distal pulses, no edema  Abdomen: +BS x4, obese, soft, non tender, non distended  Extremities: Normal bulk and tone, no edema  Skin: See below        Neuro: Alert/oriented, following commands, LUE/LLE mild drift, PERRLA, bilateral upper/lower motor strength, NIH score 2 GU: Deferred    Resolved Hospital Problem list     Assessment & Plan:  #Left-sided weakness  #Right basal ganglia hemorrhage likely secondary to hypertension  #Right cavernous ICA tiny aneurysm per neuro likely not related to the hemorrhage  Query underlying mass lesion in the setting of lung nodule - Neurology & Neuro Surgery consulted, appreciate input - f/u CT head @ 22:00 (6 hours after previous CT) - MRI brain w/wo ordered - continuous cardiac monitoring - HOB @ 30 degrees, fall precautions, strict bedrest x 24 hours then PT/OT - SLP eval if patient does not pass swallow screen - NIHSS Q 1 hour per protocol - Echocardiogram ordered  Essential Hypertension Hyperlipidemia - continue cleviprex drip PRN to maintain SBP 130-150 - hold PO antihypertensives at this time, consider restarting Losartan as patient stabilizes - restart Pravastatin once patient has passed bedside swallow or SLP eval - f/u Echo  #Right Lung Nodule - f/u CT chest to evaluate nodule more closely - supplemental O2 PRN to maintain SpO2 > 92% - continuous pulse oximetry monitoring  Best Practice (right click and "Reselect all SmartList Selections" daily)  Diet/type: NPO, swallow screen ordered DVT prophylaxis: SCD's   Pressure ulcer(s): not present on admission  GI prophylaxis: PPI Lines: N/A Foley:  N/A Code Status:  full code Last date of multidisciplinary goals of care discussion [01/04/23]  Labs   CBC: Recent Labs  Lab 01/04/23 1305  WBC 9.3  HGB 13.8  HCT 39.3  MCV 87.1  PLT 237    Basic Metabolic Panel: Recent Labs  Lab 01/04/23 1305  NA 136  K 3.7  CL 104  CO2 23  GLUCOSE 134*  BUN 10  CREATININE 0.70  CALCIUM 9.0   GFR: Estimated Creatinine Clearance: 50.1 mL/min (by C-G formula based on SCr of 0.7 mg/dL). Recent Labs  Lab 01/04/23 1305  WBC 9.3    Liver Function Tests: No results for input(s): "AST", "ALT", "ALKPHOS", "BILITOT", "PROT", "ALBUMIN" in the last 168 hours. No  results for input(s): "LIPASE", "AMYLASE" in the last 168 hours. No results for input(s): "AMMONIA" in the last 168 hours.  ABG No results found for: "PHART", "PCO2ART", "PO2ART", "HCO3", "TCO2", "ACIDBASEDEF", "O2SAT"   Coagulation Profile: No results for input(s): "INR", "PROTIME" in the last 168 hours.  Cardiac Enzymes: No results for input(s): "CKTOTAL", "CKMB", "CKMBINDEX", "TROPONINI" in the last 168 hours.  HbA1C: No results found for: "HGBA1C"  CBG: No results for input(s): "GLUCAP" in the last 168 hours.  Review of Systems: Positives in BOLD  Gen: Denies fever, chills, weight change, fatigue, night sweats HEENT: Denies blurred vision, double vision, hearing loss, tinnitus, sinus congestion, rhinorrhea, sore throat, neck stiffness, dysphagia PULM: Denies shortness of breath, cough, sputum production, hemoptysis, wheezing CV: Denies chest pain, edema, orthopnea, paroxysmal nocturnal dyspnea, palpitations GI: Denies abdominal pain, nausea, vomiting, diarrhea, hematochezia, melena, constipation, change in bowel habits GU: Denies dysuria, hematuria, polyuria, oliguria, urethral discharge Endocrine: Denies hot or cold intolerance, polyuria, polyphagia or appetite change Derm: Denies rash, dry skin, scaling or peeling skin change Heme: Denies easy bruising, bleeding, bleeding gums Neuro: Denies headache, numbness, LEFT sided weakness, slurred speech, loss of  memory or consciousness  Past Medical History:  She,  has a past medical history of Cancer (HCC).   Surgical History:   Past Surgical History:  Procedure Laterality Date   ABDOMINAL HYSTERECTOMY     BREAST BIOPSY Right 10/12/2007   neg   BREAST EXCISIONAL BIOPSY Right      Social History:   reports that she has never smoked. She has never used smokeless tobacco. She reports that she does not drink alcohol.   Family History:  Her family history includes Breast cancer (age of onset: 16) in an other family member.    Allergies No Known Allergies   Home Medications  Prior to Admission medications   Medication Sig Start Date End Date Taking? Authorizing Provider  alendronate (FOSAMAX) 70 MG tablet Take 70 mg by mouth once a week. 08/28/19   [provider]  cephALEXin (KEFLEX) 500 MG capsule Take 1 capsule (500 mg total) by mouth 2 (two) times daily. 10/03/19   Domenick Gong, MD  Cholecalciferol 50 MCG (2000 UT) CAPS Take by mouth. 10/06/18   [provider]  cyanocobalamin 1000 MCG tablet Take 2 tablets daily for 2 weeks, then reduce to 1 tablet daily thereafter for Vitamin B12 Deficiency. 04/05/18   [provider]  losartan (COZAAR) 25 MG tablet Take 25 mg by mouth daily. 08/24/19   [provider]  phenazopyridine (PYRIDIUM) 200 MG tablet Take 1 tablet (200 mg total) by mouth 2 (two) times daily as needed for pain. 10/03/19   Domenick Gong, MD  pravastatin (PRAVACHOL) 20 MG tablet Take 20 mg by mouth at bedtime. 07/03/19   [provider]     Critical care time: 68 minutes     Betsey Holiday, AGACNP-BC Acute Care Nurse Practitioner [facility]   [phone] / [phone] Please see Amion for details.

## 2023-01-04 NOTE — Progress Notes (Signed)
eLink Physician-Brief Progress Note Patient Name: Sandee Portes DOB: 1945/11/09 MRN: [id number]   Date of Service  01/04/2023  HPI/Events of Note  77 yo female with a PMH of Essential HTN and HLD who presented to [facility] ER via EMS on 12/3 with acute onset of left-sided weakness found to have a right basal ganglia hemorrhage and right cavernous ICA aneurysm.  Admitted to ICU for neurocritical care monitoring.  Vital signs are within normal limits on clevidipine infusion. Results with normal bmp and cbc.  CT head with 1.5 mL bleed with ICH 0 in the basal ganglia.   eICU Interventions  Continue strict BP control 130-150, ICH precautions, elevate HOB. Interval CT  Standard stroke risk stratification workup   DVT prophylaxis with SCDs GI prophylaxis with pantoprazole.   Intervention Category Evaluation Type: New Patient Evaluation  Nickia Boesen 01/04/2023, 9:05 PM

## 2023-01-04 NOTE — ED Notes (Signed)
Troponin delayed due to pt at CT

## 2023-01-04 NOTE — ED Notes (Signed)
Caregiver Joni Reining updated per pt request

## 2023-01-04 NOTE — ED Triage Notes (Addendum)
First nurse note: pt to ED [facility] from home for left sided weakness started at 2030 last night. Ems reports negative stroke screen and no deficits.  Bp 182/105 Cbg 116

## 2023-01-04 NOTE — ED Provider Notes (Signed)
[facility] Provider Note    Event Date/Time   First MD Initiated Contact with Patient 01/04/23 1324     (approximate)   History   Weakness   HPI  Paige Griffin is a 77 y.o. female past medical history of hypertension who presents to the emergency department with left-sided weakness.  Patient states that last night or starting around 8:30 PM started to note left-sided weakness.  States that she felt weak in her left leg causing her to fall.  Also complaining of some weakness in her left arm.  Does believe that she hit her head with the fall.  Denies being on any anticoagulation.  No history of CVA.  Denies fever, chills, abdominal pain, nausea, vomiting, chest pain or dysuria.     Physical Exam   Triage Vital Signs: ED Triage Vitals  Encounter Vitals Group     BP 01/04/23 1301 (!) 147/98     Systolic BP Percentile --      Diastolic BP Percentile --      Pulse Rate 01/04/23 1301 86     Resp 01/04/23 1301 17     Temp 01/04/23 1301 98.1 F (36.7 C)     Temp Source 01/04/23 1301 Oral     SpO2 01/04/23 1301 96 %     Weight 01/04/23 1258 115 lb (52.2 kg)     Height 01/04/23 1258 5\' 4"  (1.626 m)     Head Circumference --      Peak Flow --      Pain Score 01/04/23 1258 0     Pain Loc --      Pain Education --      Exclude from Growth Chart --     Most recent vital signs: Vitals:   01/04/23 1301  BP: (!) 147/98  Pulse: 86  Resp: 17  Temp: 98.1 F (36.7 C)  SpO2: 96%    Physical Exam Constitutional:      Appearance: She is well-developed.  HENT:     Head:     Comments: Ecchymosis to the forehead Eyes:     Extraocular Movements: Extraocular movements intact.     Conjunctiva/sclera: Conjunctivae normal.     Pupils: Pupils are equal, round, and reactive to light.  Cardiovascular:     Rate and Rhythm: Regular rhythm.  Pulmonary:     Effort: No respiratory distress.  Abdominal:     General: There is no distension.   Musculoskeletal:        General: Normal range of motion.     Cervical back: Normal range of motion.  Skin:    General: Skin is warm.     Comments: Ecchymosis to the left shoulder and left upper arm.  Skin tear to the left forearm  Neurological:     Mental Status: She is alert.     GCS: GCS eye subscore is 4. GCS verbal subscore is 5. GCS motor subscore is 6.     Cranial Nerves: Cranial nerves 2-12 are intact.     Sensory: Sensation is intact.     Motor: Weakness and pronator drift present.     Coordination: Coordination is intact.     Comments: Left upper extremity pronator drift. 4+/5 strength to the left lower extremity.  5/5 strength to the right lower extremity.  ambulation abnormal gait, dragging the left leg.     IMPRESSION / MDM / ASSESSMENT AND PLAN / ED COURSE  I reviewed the triage vital signs and the  nursing notes.  Differential diagnosis including intracranial hemorrhage, acute CVA, electrolyte abnormality, dehydration  Patient is outside of the window for TNK.    EKG  I, Corena Herter, the attending physician, personally viewed and interpreted this ECG.   Rate: Normal  Rhythm: Normal sinus  Axis: Normal  Intervals: Normal  ST&T Change: Nonspecific ST depression.  No significant ST elevation   RADIOLOGY I independently reviewed imaging, my interpretation of imaging: CT scan of the head -intraparenchymal hemorrhage of the basal ganglia  LABS (all labs ordered are listed, but only abnormal results are displayed) Labs interpreted as -    Labs Reviewed  BASIC METABOLIC PANEL - Abnormal; Notable for the following components:      Result Value   Glucose, Bld 134 (*)    All other components within normal limits  CBC  URINALYSIS, ROUTINE W REFLEX MICROSCOPIC  CBG MONITORING, ED  TROPONIN I (HIGH SENSITIVITY)     MDM  On exam patient has focal left upper extremity and left lower extremity weakness.  Concern for acute CVA.  Plan for CT scan of the head and  neck given her fall.  X-rays to the left upper extremity given her fall.  Does have a skin tear but tetanus is up-to-date.    No significant electrolyte abnormality.  No signs of anemia.    3:06 PM On my evaluation CT scan of the head with findings concerning for intraparenchymal hemorrhage of the basal ganglia concerning for hemorrhagic stroke.  Consulted neurology, discussed with Dr. Selina Cooley who will come and evaluate the patient in the emergency department.   PROCEDURES:  Critical Care performed: yes  .Critical Care  Performed by: Corena Herter, MD Authorized by: Corena Herter, MD   Critical care provider statement:    Critical care time (minutes):  50   Critical care time was exclusive of:  Separately billable procedures and treating other patients   Critical care was necessary to treat or prevent imminent or life-threatening deterioration of the following conditions:  CNS failure or compromise   Critical care was time spent personally by me on the following activities:  Development of treatment plan with patient or surrogate, discussions with consultants, evaluation of patient's response to treatment, examination of patient, ordering and review of laboratory studies, ordering and review of radiographic studies, ordering and performing treatments and interventions, pulse oximetry, re-evaluation of patient's condition and review of old charts   Care discussed with: admitting provider     Patient's presentation is most consistent with acute presentation with potential threat to life or bodily function.   MEDICATIONS ORDERED IN ED: Medications - No data to display  FINAL CLINICAL IMPRESSION(S) / ED DIAGNOSES   Final diagnoses:  Hemorrhagic stroke (HCC)     Rx / DC Orders   ED Discharge Orders     None        Note:  This document was prepared using Dragon voice recognition software and may include unintentional dictation errors.   Corena Herter, MD 01/04/23  1506

## 2023-01-04 NOTE — ED Notes (Signed)
RN with pt to CT

## 2023-01-04 NOTE — ED Triage Notes (Signed)
Pt sts that around 0830 last night she started to have weakness on the left side of her body. Pt is not having any head pain or dizziness. Pt has an equal smile, grips, pt is A/Ox4.

## 2023-01-05 ENCOUNTER — Inpatient Hospital Stay: Payer: PPO

## 2023-01-05 ENCOUNTER — Inpatient Hospital Stay
Admit: 2023-01-05 | Discharge: 2023-01-05 | Disposition: A | Discharge status: Home / Self Care | Payer: PPO | Attending: Critical Care Medicine | Admitting: Critical Care Medicine

## 2023-01-05 ENCOUNTER — Inpatient Hospital Stay (HOSPITAL_COMMUNITY)
Admit: 2023-01-05 | Discharge: 2023-01-05 | Disposition: A | Discharge status: Home / Self Care | Payer: PPO | Attending: Critical Care Medicine

## 2023-01-05 DIAGNOSIS — I6389 Other cerebral infarction: Secondary | ICD-10-CM

## 2023-01-05 DIAGNOSIS — I619 Nontraumatic intracerebral hemorrhage, unspecified: Secondary | ICD-10-CM | POA: Diagnosis not present

## 2023-01-05 LAB — URINALYSIS, ROUTINE W REFLEX MICROSCOPIC
Bilirubin Urine: NEGATIVE
Glucose, UA: NEGATIVE mg/dL
Hgb urine dipstick: NEGATIVE
Ketones, ur: NEGATIVE mg/dL
Nitrite: NEGATIVE
Protein, ur: NEGATIVE mg/dL
Specific Gravity, Urine: 1.011 (ref 1.005–1.030)
pH: 6 (ref 5.0–8.0)

## 2023-01-05 LAB — ECHOCARDIOGRAM COMPLETE
AR max vel: 2.63 cm2
AV Area VTI: 2.51 cm2
AV Area mean vel: 2.69 cm2
AV Mean grad: 4 mm[Hg]
AV Peak grad: 8.8 mm[Hg]
Ao pk vel: 1.48 m/s
Area-P 1/2: 3.13 cm2
Height: 64 in
MV VTI: 2.54 cm2
S' Lateral: 2.8 cm
Weight: 1840 [oz_av]

## 2023-01-05 LAB — GLUCOSE, CAPILLARY
Glucose-Capillary: 101 mg/dL — ABNORMAL HIGH (ref 70–99)
Glucose-Capillary: 102 mg/dL — ABNORMAL HIGH (ref 70–99)
Glucose-Capillary: 130 mg/dL — ABNORMAL HIGH (ref 70–99)
Glucose-Capillary: 131 mg/dL — ABNORMAL HIGH (ref 70–99)
Glucose-Capillary: 170 mg/dL — ABNORMAL HIGH (ref 70–99)
Glucose-Capillary: 89 mg/dL (ref 70–99)
Glucose-Capillary: 92 mg/dL (ref 70–99)

## 2023-01-05 LAB — CBC
HCT: 37 % (ref 36.0–46.0)
Hemoglobin: 12.9 g/dL (ref 12.0–15.0)
MCH: 30.7 pg (ref 26.0–34.0)
MCHC: 34.9 g/dL (ref 30.0–36.0)
MCV: 88.1 fL (ref 80.0–100.0)
Platelets: 205 10*3/uL (ref 150–400)
RBC: 4.2 MIL/uL (ref 3.87–5.11)
RDW: 12.3 % (ref 11.5–15.5)
WBC: 8.2 10*3/uL (ref 4.0–10.5)
nRBC: 0 % (ref 0.0–0.2)

## 2023-01-05 LAB — BASIC METABOLIC PANEL
Anion gap: 8 (ref 5–15)
BUN: 16 mg/dL (ref 8–23)
CO2: 24 mmol/L (ref 22–32)
Calcium: 8.6 mg/dL — ABNORMAL LOW (ref 8.9–10.3)
Chloride: 103 mmol/L (ref 98–111)
Creatinine, Ser: 0.82 mg/dL (ref 0.44–1.00)
GFR, Estimated: >60 mL/min (ref >60)
Glucose, Bld: 97 mg/dL (ref 70–99)
Potassium: 3.2 mmol/L — ABNORMAL LOW (ref 3.5–5.1)
Sodium: 135 mmol/L (ref 135–145)

## 2023-01-05 LAB — PHOSPHORUS: Phosphorus: 3.3 mg/dL (ref 2.5–4.6)

## 2023-01-05 LAB — MAGNESIUM: Magnesium: 2.6 mg/dL — ABNORMAL HIGH (ref 1.7–2.4)

## 2023-01-05 MED ORDER — POTASSIUM CHLORIDE CRYS ER 20 MEQ PO TBCR
40.0000 meq | EXTENDED_RELEASE_TABLET | Freq: Once | ORAL | Status: AC
  Administered 2023-01-05: 40 meq via ORAL
  Filled 2023-01-05: qty 2

## 2023-01-05 MED ORDER — ENSURE ENLIVE PO LIQD
237.0000 mL | Freq: Two times a day (BID) | ORAL | Status: DC
  Administered 2023-01-05 (×2): 237 mL via ORAL

## 2023-01-05 MED ORDER — LOSARTAN POTASSIUM 50 MG PO TABS
25.0000 mg | ORAL_TABLET | Freq: Every day | ORAL | Status: DC
  Administered 2023-01-05 – 2023-01-06 (×2): 25 mg via ORAL
  Filled 2023-01-05 (×2): qty 1

## 2023-01-05 MED ORDER — GADOBUTROL 1 MMOL/ML IV SOLN
5.0000 mL | Freq: Once | INTRAVENOUS | Status: AC | PRN
  Administered 2023-01-05: 5 mL via INTRAVENOUS

## 2023-01-05 MED ORDER — PRAVASTATIN SODIUM 20 MG PO TABS
20.0000 mg | ORAL_TABLET | Freq: Every day | ORAL | Status: DC
  Administered 2023-01-05: 20 mg via ORAL
  Filled 2023-01-05: qty 1

## 2023-01-05 MED ORDER — HYDRALAZINE HCL 20 MG/ML IJ SOLN
5.0000 mg | INTRAMUSCULAR | Status: DC | PRN
  Administered 2023-01-05 – 2023-01-06 (×2): 5 mg via INTRAVENOUS
  Filled 2023-01-05 (×2): qty 1

## 2023-01-05 MED ORDER — ADULT MULTIVITAMIN W/MINERALS CH
1.0000 | ORAL_TABLET | Freq: Every day | ORAL | Status: DC
  Administered 2023-01-05 – 2023-01-06 (×2): 1 via ORAL
  Filled 2023-01-05 (×2): qty 1

## 2023-01-05 NOTE — Progress Notes (Signed)
BP 164/95 providers notified. Norvasc to be ordered.

## 2023-01-05 NOTE — Progress Notes (Signed)
*  PRELIMINARY RESULTS* Echocardiogram 2D Echocardiogram has been performed.  Paige Griffin 01/05/2023, 3:23 PM

## 2023-01-05 NOTE — Progress Notes (Signed)
Initial Nutrition Assessment  DOCUMENTATION CODES:   Non-severe (moderate) malnutrition in context of chronic illness  INTERVENTION:   -Continue regular diet -Ensure Enlive po BID, each supplement provides 350 kcal and 20 grams of protein.  -MVI with minerals daily   NUTRITION DIAGNOSIS:   Moderate Malnutrition related to chronic illness (possible lung nodule mass) as evidenced by mild fat depletion, moderate fat depletion, mild muscle depletion, moderate muscle depletion.  GOAL:   Patient will meet greater than or equal to 90% of their needs  MONITOR:   PO intake, Supplement acceptance  REASON FOR ASSESSMENT:   Consult Assessment of nutrition requirement/status  ASSESSMENT:   Pt with a PMH of Essential HTN and HLD who presented with acute onset of left-sided weakness.  Pt admitted with lt sided weakness, rt basal ganglia hemorrhage secondary to hypertension, and rt cavernous ICA tiny aneurysm.   Reviewed I/O's: -467 ml x 24 hours  UOP: 500 ml x 24 hours  Per MD notes, pt also with underlying mass lesion in the setting of lung nodule. Plan for outpatient PET scan.   Case discussed with SLP and RN; RD consult requested secondary to concern for weight loss and ill-fitting dentures.   Pt sleeping soundly at time of visit, but awoke to touch. She reports feeling better today. She shares that she has a fair appetite, consumed a bowl of grits this morning without difficulty. Pt shares that she currently has a partial plate, which she reports works well "as long as I can glue them in". Pt is awaiting full set of dentures in the future. She denies any difficulty chewing or swallowing, but is hopeful to receive a full set of dentures soon.   Pt reports good appetite PTA. She lives with her husband and she prepares and shops for meals. She typically consumes a meat, starch, and vegetable for lunch and dinner; breakfast is either eggs or a banana sandwich. She drinks coffee  throughout the day.   Pt does not think she has lost weight. She reports her UBW is around 140#. Per CareEverywhere, wt was 116# on 07/22/22. Wt has been stable over the past 6 months. She denies any decline in functional mobility and no recent falls PTA.   Discussed importance of good meal and supplement intake to promote healing. Pt amenable to supplements.   Medications reviewed and include protonix, senokot, and cleviprex.   No results found for: "HGBA1C" PTA DM medications are none.   Labs reviewed: K: 3.2, CBGS: 89-170 (inpatient orders for glycemic control are none).    NUTRITION - FOCUSED PHYSICAL EXAM:  Flowsheet Row Most Recent Value  Orbital Region Moderate depletion  Upper Arm Region Moderate depletion  Thoracic and Lumbar Region Mild depletion  Buccal Region Mild depletion  Temple Region Moderate depletion  Clavicle Bone Region Moderate depletion  Clavicle and Acromion Bone Region Moderate depletion  Scapular Bone Region Moderate depletion  Dorsal Hand Moderate depletion  Patellar Region Moderate depletion  Anterior Thigh Region Moderate depletion  Posterior Calf Region Moderate depletion  Edema (RD Assessment) None  Hair Reviewed  Eyes Reviewed  Mouth Reviewed  Skin Reviewed  Nails Reviewed       Diet Order:   Diet Order             Diet regular Room service appropriate? Yes; Fluid consistency: Thin  Diet effective now                   EDUCATION NEEDS:   Education  needs have been addressed  Skin:  Skin Assessment: Reviewed RN Assessment  Last BM:  01/04/23  Height:   Ht Readings from Last 1 Encounters:  01/04/23 5\' 4"  (1.626 m)    Weight:   Wt Readings from Last 1 Encounters:  01/04/23 52.2 kg    Ideal Body Weight:  54.5 kg  BMI:  Body mass index is 19.74 kg/m.  Estimated Nutritional Needs:   Kcal:  1650-1850  Protein:  80-95 grams  Fluid:  > 1.6 L    Levada Schilling, RD, LDN, CDCES Registered Dietitian III Certified  Diabetes Care and Education Specialist Please refer to [facility] for RD and/or RD on-call/weekend/after hours pager

## 2023-01-05 NOTE — Plan of Care (Signed)
  Problem: Education: Goal: Knowledge of disease or condition will improve Outcome: Progressing   Problem: Intracerebral Hemorrhage Tissue Perfusion: Goal: Complications of Intracerebral Hemorrhage will be minimized Outcome: Progressing   Problem: Coping: Goal: Will verbalize positive feelings about self Outcome: Progressing   Problem: Self-Care: Goal: Ability to participate in self-care as condition permits will improve Outcome: Progressing Goal: Ability to communicate needs accurately will improve Outcome: Progressing   Problem: Nutrition: Goal: Risk of aspiration will decrease Outcome: Progressing   Problem: Education: Goal: Knowledge of General Education information will improve Description: Including pain rating scale, medication(s)/side effects and non-pharmacologic comfort measures Outcome: Progressing   Problem: Clinical Measurements: Goal: Respiratory complications will improve Outcome: Progressing Goal: Cardiovascular complication will be avoided Outcome: Progressing   Problem: Activity: Goal: Risk for activity intolerance will decrease Outcome: Progressing   Problem: Coping: Goal: Level of anxiety will decrease Outcome: Progressing   Problem: Pain Management: Goal: General experience of comfort will improve Outcome: Progressing   Problem: Skin Integrity: Goal: Risk for impaired skin integrity will decrease Outcome: Progressing   Problem: Clinical Measurements: Goal: Neurologic status will improve Outcome: Progressing   Problem: Respiratory: Goal: Will regain and/or maintain adequate ventilation Outcome: Progressing   Problem: Skin Integrity: Goal: Risk for impaired skin integrity will decrease Outcome: Progressing   Problem: Psychosocial: Goal: Ability to participate in self-care as condition permits will improve Outcome: Progressing   Problem: Skin Integrity: Goal: Risk for impaired skin integrity will decrease Outcome: Progressing

## 2023-01-05 NOTE — Progress Notes (Signed)
NAME:  Paige Griffin, MRN:  [id number], DOB:  [birth year], LOS: 1 ADMISSION DATE:  01/04/2023, CONSULTATION DATE: 01/04/2023 REFERRING MD: Dr. Anner Crete, CHIEF COMPLAINT: Left-sided weakness    History of Present Illness:  This is a 77 yo female with a PMH of Essential HTN and HLD who presented to [facility] ER via EMS on 12/3 with acute onset of left-sided weakness.  She reports she was standing at the counter brushing her teeth around 2030 when she developed left-sided weakness.  Due to the weakness she fell on the floor and hit her head.  The fall caused bruising of the left arm along with a skin tear and bilateral knees.  She is not on any anticoagulants.  Per ED notes EMS reported pt had a negative stroke screen upon their assessment.    ED Course  Upon arrival to the pt continued to c/o left upper and lower extremity weakness, however worse in the left upper extremity.  Lab results were within normal limits.  CT Head revealed an acute hemorrhage in the right basal ganglia and thalamus.  On call neurologist Dr. Selina Cooley contacted by EDP and recommended obtaining CTA Head to evaluate for vascular abnormalities.  CTA Head negative for vascular malformation or active extravasation, but concerning for 1-2 mm medially projecting outpouching from the right cavernous ICA may represent a tiny aneurysm.  EDP discussed results with on call neurologist Dr. Selina Cooley who recommended admission to [facility] ICU with no indications to transfer to [facility].  Dr. Selina Cooley also recommended starting cleviprex gtt for goal sbp 130-150.  PCCM team contacted for ICU admission.    CT Head/Cervical Spine:  Acute hemorrhage in the right basal ganglia and thalamus, which measures up to 1.5 cm, with an approximate volume of 2 mL. Surrounding edema without significant mass effect or midline shift. No acute fracture or traumatic listhesis in the cervical spine. Right upper lobe mass, which measures up to 2.0 cm, concerning for malignancy.  CTA  Head W OR WO Contrast:  No evidence of active extravasation into the right basal ganglia/thalamus hemorrhage. No evidence of vascular malformation. No intracranial large vessel occlusion or significant stenosis. 1-2 mm medially projecting outpouching from the right cavernous ICA may represent a tiny aneurysm. Additional projections from the bilateral distal supraclinoid ICA are favored to represent infundibulla at the origins of the posterior communicating arteries.  Please see "Significant Hospital Events" section below for full detailed hospital course.  Pertinent  Medical History  Essential HTN  HLD  Vitamin B12 Deficiency  Vitamin D Deficiency  Age-related osteoporosis without pathological fracture   Significant Hospital Events: Including procedures, antibiotic start and stop dates in addition to other pertinent events   12/03: Pt admitted with traumatic right basal ganglia hemorrhage suspect secondary to hypertension requiring cleviprex gtt for goal sbp 130-150  12/04: Off Cleviprex, Neuro exam stable. MRI Brain: IMPRESSION: 1. An acute parenchymal hemorrhage centered at the right thalamus, posterior limb of internal capsule and corona radiata is unchanged in volume from the head CT examinations of 01/04/2023. Mild-to-moderate surrounding edema. The presence of hemorrhage limits evaluation for pathologic enhancement at this site. Within this limitation, no masslike or nodular enhancement is appreciated. This could reflect a hypertensive hemorrhage. However, a follow-up brain MRI (within and without contrast) is recommended once the hemorrhage resolves to more definitively exclude an underlying metastasis. 2. Background mild cerebral white matter chronic small vessel ischemic disease. 3. Mild cerebral atrophy.  Interim History / Subjective:  -No significant events noted  overnight -Afebrile, hemodynamically stable, weaned off Cleviprex last night at approximately 20:00 -No complaints of  headache or blurred vision -Does endorse left upper and lower extremity weakness ~ Neuro exam unchanged, remains with slight weakness and drip to left upper and lower extremities -Repeat CT Head last night stable -MRI Brain and Echo pending -CT Chest with spiculated nodule in Right lung apex concerning for malignancy ~ will need outpatient workup, discussed with Dr. Belia Heman who will get pt set up with [facility]   Objective   Blood pressure (!) 152/88, pulse 70, temperature 98.2 F (36.8 C), temperature source Oral, resp. rate 12, height 5\' 4"  (1.626 m), weight 52.2 kg, SpO2 97%.        Intake/Output Summary (Last 24 hours) at 01/05/2023 0802 Last data filed at 01/05/2023 1610 Gross per 24 hour  Intake 33.54 ml  Output 500 ml  Net -466.46 ml   Filed Weights   01/04/23 1258  Weight: 52.2 kg    Examination: General: Acutely-ill appearing female, NAD resting in bed on RA  HENT: Supple, no JVD  Lungs: Clear throughout, even, non labored  Cardiovascular: NSR, s1s2, no m/r/g, 2+ radial/2+ distal pulses, no edema  Abdomen: +BS x4, obese, soft, non tender, non distended  Extremities: Normal bulk and tone, no edema  Skin: See below        Neuro: Awake, and Alert/oriented, following commands, LUE/LLE mild drift, PERRLA, bilateral upper/lower motor strength, NIH score 2 GU: Deferred   Resolved Hospital Problem list     Assessment & Plan:   #Left-sided weakness  #Right basal ganglia hemorrhage likely secondary to hypertension  #Right cavernous ICA tiny aneurysm per neuro likely not related to the hemorrhage  Query underlying mass lesion in the setting of lung nodule MRI Brain 12/4: IMPRESSION: 1. An acute parenchymal hemorrhage centered at the right thalamus, posterior limb of internal capsule and corona radiata is unchanged in volume from the head CT examinations of 01/04/2023. Mild-to-moderate surrounding edema. The presence of hemorrhage limits evaluation for pathologic  enhancement at this site. Within this limitation, no masslike or nodular enhancement is appreciated. This could reflect a hypertensive hemorrhage. However, a follow-up brain MRI (within and without contrast) is recommended once the hemorrhage resolves to more definitively exclude an underlying metastasis. 2. Background mild cerebral white matter chronic small vessel ischemic disease. 3. Mild cerebral atrophy. -ICU monitoring -Neuro assessments as per protocol and Neurology recs -Neurology & Neuro Surgery following, appreciate input -HOB @ 30 degrees, fall precautions, strict bedrest x 24 hours then PT/OT -Passed bedside swallow evaluation -Echocardiogram pending  #Essential Hypertension #Hyperlipidemia -Continuous cardiac monitoring -Maintain goal SBP 130-150 -Cleviprex drip as PRN to maintain goal SBP -Echocardiogram pending -Consider restarting outpatient Losartan as patient stabilizes -Restart Pravastatin   #Right Lung Nodule CT Chest 01/04/23 with 1.8 x 2.2 cm spiculated nodule in right apex concerning for malignancy -Will need outpatient PET scan and/or tissue sampling ~ discussed with Dr. Belia Heman, to have her set up with  Pulmonary for outpatient workup -Supplemental O2 as needed to maintain O2 sats >92% -Follow intermittent Chest X-ray & ABG as needed -Bronchodilators prn -Pulmonary toilet as able    Best Practice (right click and "Reselect all SmartList Selections" daily)  Diet/type: Regular DVT prophylaxis: SCD's Start: 01/04/23 1902SCD's Pressure ulcer(s): not present on admission  GI prophylaxis: PPI Lines: N/A Foley:  N/A Code Status:  full code Last date of multidisciplinary goals of care discussion [01/05/23]  12/4: Pt updated at bedside on plan of care.  All questions answered.  Labs   CBC: Recent Labs  Lab 01/04/23 1305 01/05/23 0443  WBC 9.3 8.2  HGB 13.8 12.9  HCT 39.3 37.0  MCV 87.1 88.1  PLT 237 205    Basic Metabolic Panel: Recent  Labs  Lab 01/04/23 1305 01/05/23 0443  NA 136 135  K 3.7 3.2*  CL 104 103  CO2 23 24  GLUCOSE 134* 97  BUN 10 16  CREATININE 0.70 0.82  CALCIUM 9.0 8.6*  MG  --  2.6*  PHOS  --  3.3   GFR: Estimated Creatinine Clearance: 48.8 mL/min (by C-G formula based on SCr of 0.82 mg/dL). Recent Labs  Lab 01/04/23 1305 01/05/23 0443  WBC 9.3 8.2    Liver Function Tests: No results for input(s): "AST", "ALT", "ALKPHOS", "BILITOT", "PROT", "ALBUMIN" in the last 168 hours. No results for input(s): "LIPASE", "AMYLASE" in the last 168 hours. No results for input(s): "AMMONIA" in the last 168 hours.  ABG No results found for: "PHART", "PCO2ART", "PO2ART", "HCO3", "TCO2", "ACIDBASEDEF", "O2SAT"   Coagulation Profile: No results for input(s): "INR", "PROTIME" in the last 168 hours.  Cardiac Enzymes: No results for input(s): "CKTOTAL", "CKMB", "CKMBINDEX", "TROPONINI" in the last 168 hours.  HbA1C: No results found for: "HGBA1C"  CBG: Recent Labs  Lab 01/04/23 2003 01/05/23 0025 01/05/23 0339 01/05/23 0722  GLUCAP 131* 102* 101* 89    Review of Systems: Positives in BOLD  Gen: Denies fever, chills, weight change, fatigue, night sweats HEENT: Denies blurred vision, double vision, hearing loss, tinnitus, sinus congestion, rhinorrhea, sore throat, neck stiffness, dysphagia PULM: Denies shortness of breath, cough, sputum production, hemoptysis, wheezing CV: Denies chest pain, edema, orthopnea, paroxysmal nocturnal dyspnea, palpitations GI: Denies abdominal pain, nausea, vomiting, diarrhea, hematochezia, melena, constipation, change in bowel habits GU: Denies dysuria, hematuria, polyuria, oliguria, urethral discharge Endocrine: Denies hot or cold intolerance, polyuria, polyphagia or appetite change Derm: Denies rash, dry skin, scaling or peeling skin change Heme: Denies easy bruising, bleeding, bleeding gums Neuro: Denies headache, numbness, LEFT sided weakness, slurred speech,  loss of memory or consciousness  Past Medical History:  She,  has a past medical history of Cancer (HCC).   Surgical History:   Past Surgical History:  Procedure Laterality Date   ABDOMINAL HYSTERECTOMY     BREAST BIOPSY Right 10/12/2007   neg   BREAST EXCISIONAL BIOPSY Right      Social History:   reports that she has never smoked. She has never used smokeless tobacco. She reports that she does not drink alcohol.   Family History:  Her family history includes Breast cancer (age of onset: 56) in an other family member.   Allergies No Known Allergies   Home Medications  Prior to Admission medications   Medication Sig Start Date End Date Taking? Authorizing Provider  alendronate (FOSAMAX) 70 MG tablet Take 70 mg by mouth once a week. 08/28/19   [provider]  cephALEXin (KEFLEX) 500 MG capsule Take 1 capsule (500 mg total) by mouth 2 (two) times daily. 10/03/19   Domenick Gong, MD  Cholecalciferol 50 MCG (2000 UT) CAPS Take by mouth. 10/06/18   [provider]  cyanocobalamin 1000 MCG tablet Take 2 tablets daily for 2 weeks, then reduce to 1 tablet daily thereafter for Vitamin B12 Deficiency. 04/05/18   [provider]  losartan (COZAAR) 25 MG tablet Take 25 mg by mouth daily. 08/24/19   [provider]  phenazopyridine (PYRIDIUM) 200 MG tablet Take 1 tablet (200 mg  total) by mouth 2 (two) times daily as needed for pain. 10/03/19   Domenick Gong, MD  pravastatin (PRAVACHOL) 20 MG tablet Take 20 mg by mouth at bedtime. 07/03/19   [provider]     Critical care time: 40 minutes    Harlon Ditty, AGACNP-BC [facility] Prefer epic messenger for cross cover needs If after hours, please call E-link

## 2023-01-05 NOTE — Progress Notes (Signed)
SLP Cancellation Note  Patient Details Name: Essance Mcspadden MRN: [id number] DOB: 1946/01/06   Cancelled treatment:       Reason Eval/Treat Not Completed:  (consulted NSG, reviewed chart)  Discussed w/ NSG the need for secure partial denture plate for both speech and effective mastication w/ meals. NSG stated when she used the adhesive to secure the plate, pt's speech was much improved and oral intake was appropriate.  ST services will f/u tomorrow for any further needs.      Jerilynn Som, MS, CCC-SLP Speech Language Pathologist Rehab Services; [facility] [phone] (ascom) Izaih Kataoka 01/05/2023, 4:30 PM

## 2023-01-05 NOTE — Progress Notes (Addendum)
Patient has not voided this shift.  Bladder scan preformed and revealed of urine in bladder.  Dr. Belia Heman and Dr. Evelena Leyden aware.  Per Dr. Evelena Leyden get patient out of bed and moving and push fluids.  If patient has not voided by 5PM perform in and out.

## 2023-01-05 NOTE — Progress Notes (Signed)
Neurology progress note  S: Patient has some L sided weakness but is at least 4-/5 throughout. No other deficits. Head CT at 6 hrs stable. Has been off clevidipine since last night.  Vitals:   01/05/23 1400 01/05/23 1500  BP: 135/80 127/85  Pulse: 83 78  Resp: 12 15  Temp:    SpO2: 98% 97%   Physical Exam Gen: A&O x4, NAD HEENT: Atraumatic, normocephalic;mucous membranes moist; oropharynx clear, tongue without atrophy or fasciculations. Neck: Supple, trachea midline. Resp: CTAB, no w/r/r CV: RRR, no m/g/r; nml S1 and S2. 2+ symmetric peripheral pulses. Abd: soft/NT/ND; nabs x 4 quad Extrem: Nml bulk; no cyanosis, clubbing, or edema.   Neuro: *MS: A&O x4. Follows multi-step commands.  *Speech: fluid, nondysarthric, able to name and repeat *CN:    I: Deferred   II,III: PERRLA, VFF by confrontation, optic discs unable to be visualized 2/2 pupillary constriction   III,IV,VI: EOMI w/o nystagmus, no ptosis   V: Sensation intact from V1 to V3 to LT   VII: Eyelid closure was full.  L NLF flattening   VIII: Hearing intact to voice   IX,X: Voice normal, palate elevates symmetrically    XI: SCM/trap 5/5 bilat   XII: Tongue protrudes midline, no atrophy or fasciculations  *Motor:   RUE and RLE no drift. LUE drift but not to bed, LLE drift to bed. *Sensory: Intact to light touch, pinprick, temperature vibration throughout. Symmetric. Propioception intact bilat.  No double-simultaneous extinction.  *Coordination:  FNF intact bilat *Reflexes:  2+ and symmetric throughout without clonus; toes down-going bilat *Gait: deferred   NIHSS = 4 for facial droop, LUE drift (1), LLE drift (2)   ICH score = 0   Premorbid mRS = 0  Imaging  CT Head without contrast: 1. Acute hemorrhage in the right basal ganglia and thalamus, which measures up to 1.5 cm, with an approximate volume of 2 mL. Surrounding edema without significant mass effect or midline shift. 2. No acute fracture or traumatic  listhesis in the cervical spine. 3. Right upper lobe mass, which measures up to 2.0 cm, concerning for malignancy.   CT angio Head and Neck with contrast: 1. No evidence of active extravasation into the right basal ganglia/thalamus hemorrhage. No evidence of vascular malformation. 2. No intracranial large vessel occlusion or significant stenosis. 3. 1-2 mm medially projecting outpouching from the right cavernous ICA may represent a tiny aneurysm. 4. Additional projections from the bilateral distal supraclinoid ICA are favored to represent infundibulla at the origins of the posterior communicating arteries.  Head CT at 6 hrs post hemorrhage - unchanged  MRI brain wwo contrast - no acute abnl, no identifiable mass lesion although interpretation somewhat obscured by blood  CNS imaging personally reviewed; I agree with above interpretations  Chest CT wo contrast 1. 1.8 x 2.2 cm spiculated nodule in the right apex, suspicious for malignancy. Recommend follow-up with PET-CT and/or tissue sampling. 2. Diffuse emphysematous changes throughout the lungs. Scattered fibrosis. 3. No significant lymphadenopathy. 4. Aortic atherosclerosis. 5. 4.2 cm ascending thoracic aortic aneurysm. Recommend annual imaging followup by CTA or MRA. This recommendation follows 2010 ACCF/AHA/AATS/ACR/ASA/SCA/SCAI/SIR/STS/SVM Guidelines for the Diagnosis and Management of Patients with Thoracic Aortic Disease. Circulation. 2010; 121: N829-F621. Aortic aneurysm NOS (ICD10-I71.9)  A/P: This is a 77 yo woman with hx hypertension who presents with left sided weakness 2/2 R basal ganglia hemorrhage. Etiology is likely hypertensive. ICH stable by serial imaging and she has been off clevidipine since last night. CT chest shows  nodule concerning for malignancy. MRI brain wwo does not show underlying mass lesion but interpretation partially obscured by blood. She should have a repeat MRI brain wwo in 2-3 mos after blood has  resorbed. The possible R cavernous ICA tiny aneurysm is not felt to be related to the hemorrhage.   - Cleared by neuro for transfer to floor and to start PT - Goal SBP 130-150 ok to transition to oral antihypertensives - SCDs for DVT prophylaxis, no anticoagulation or antiplatelets at this time - Further workup of possible pulmonary malignancy per primary team - MRI brain wwo in 2-3 mos r/o underlying mass lesion - I will arrange outpatient neuro f/u - Neurology to sign off but please re-engage if additional neurologic concerns arise.  Bing Neighbors, MD [facility] [phone]  If 7pm- 7am, please page neurology on call as listed in AMION.

## 2023-01-05 NOTE — Progress Notes (Signed)
16:16 patient voided of urine into BSC.  In and out not needed.

## 2023-01-05 NOTE — Plan of Care (Signed)
  Problem: Education: Goal: Knowledge of disease or condition will improve Outcome: Progressing Goal: Knowledge of patient specific risk factors will improve Paige Griffin N/A or DELETE if not current risk factor) Outcome: Progressing   Problem: Intracerebral Hemorrhage Tissue Perfusion: Goal: Complications of Intracerebral Hemorrhage will be minimized Outcome: Progressing   Problem: Coping: Goal: Will verbalize positive feelings about self Outcome: Progressing Goal: Will identify appropriate support needs Outcome: Progressing   Problem: Health Behavior/Discharge Planning: Goal: Ability to manage health-related needs will improve Outcome: Progressing Goal: Goals will be collaboratively established with patient/family Outcome: Progressing   Problem: Self-Care: Goal: Ability to participate in self-care as condition permits will improve Outcome: Progressing Goal: Verbalization of feelings and concerns over difficulty with self-care will improve Outcome: Progressing Goal: Ability to communicate needs accurately will improve Outcome: Progressing

## 2023-01-06 DIAGNOSIS — I619 Nontraumatic intracerebral hemorrhage, unspecified: Secondary | ICD-10-CM | POA: Diagnosis not present

## 2023-01-06 DIAGNOSIS — N390 Urinary tract infection, site not specified: Secondary | ICD-10-CM | POA: Insufficient documentation

## 2023-01-06 LAB — RENAL FUNCTION PANEL
Albumin: 3.7 g/dL (ref 3.5–5.0)
Anion gap: 5 (ref 5–15)
BUN: 16 mg/dL (ref 8–23)
CO2: 25 mmol/L (ref 22–32)
Calcium: 8.9 mg/dL (ref 8.9–10.3)
Chloride: 107 mmol/L (ref 98–111)
Creatinine, Ser: 0.67 mg/dL (ref 0.44–1.00)
GFR, Estimated: >60 mL/min (ref >60)
Glucose, Bld: 95 mg/dL (ref 70–99)
Phosphorus: 2.6 mg/dL (ref 2.5–4.6)
Potassium: 3.7 mmol/L (ref 3.5–5.1)
Sodium: 137 mmol/L (ref 135–145)

## 2023-01-06 LAB — CBC
HCT: 36.6 % (ref 36.0–46.0)
Hemoglobin: 12.7 g/dL (ref 12.0–15.0)
MCH: 30.5 pg (ref 26.0–34.0)
MCHC: 34.7 g/dL (ref 30.0–36.0)
MCV: 88 fL (ref 80.0–100.0)
Platelets: 192 10*3/uL (ref 150–400)
RBC: 4.16 MIL/uL (ref 3.87–5.11)
RDW: 12.1 % (ref 11.5–15.5)
WBC: 8.4 10*3/uL (ref 4.0–10.5)
nRBC: 0 % (ref 0.0–0.2)

## 2023-01-06 LAB — MAGNESIUM: Magnesium: 2.3 mg/dL (ref 1.7–2.4)

## 2023-01-06 MED ORDER — NITROFURANTOIN MONOHYD MACRO 100 MG PO CAPS: 100.0000 mg | ORAL_CAPSULE | Freq: Two times a day (BID) | ORAL | 0 refills | Status: DC

## 2023-01-06 MED ORDER — CHLORHEXIDINE GLUCONATE CLOTH 2 % EX PADS: 6.0000 | MEDICATED_PAD | Freq: Every evening | CUTANEOUS | Status: DC

## 2023-01-06 MED ORDER — ACETAMINOPHEN 325 MG PO TABS: 650.0000 mg | ORAL_TABLET | ORAL | 0 refills | Status: AC | PRN

## 2023-01-06 MED ORDER — ACETAMINOPHEN 325 MG PO TABS: 650.0000 mg | ORAL_TABLET | ORAL | 0 refills | Status: DC | PRN

## 2023-01-06 MED ORDER — NITROFURANTOIN MONOHYD MACRO 100 MG PO CAPS: 100.0000 mg | ORAL_CAPSULE | Freq: Two times a day (BID) | ORAL | 0 refills | Status: AC

## 2023-01-06 MED ORDER — NITROFURANTOIN MONOHYD MACRO 100 MG PO CAPS
100.0000 mg | ORAL_CAPSULE | Freq: Two times a day (BID) | ORAL | Status: DC
  Administered 2023-01-06 (×2): 100 mg via ORAL
  Filled 2023-01-06 (×2): qty 1

## 2023-01-06 NOTE — Progress Notes (Signed)
Suspected Acute Cystitis UA: + trace leuks & pyuria. Patient complaining of burning sensation when voiding with increased frequency. No suprapubic or flank pain noted on bedside exam. Consulted with Rx for local antibiogram. - ordered Nitrofurantoin 100 mg BID x 5 days   Cheryll Cockayne Rust-Chester, AGACNP-BC Acute Care Nurse Practitioner [facility]   [phone] / [phone] Please see Amion for details.

## 2023-01-06 NOTE — TOC Transition Note (Signed)
Transition of Care [facility]) - CM/SW Discharge Note   Patient Details  Name: Paige Griffin MRN: [id number] Date of Birth: 07-13-1945  [facility]) CM/SW Contact:  Allena Katz, LCSW Phone Number: 01/06/2023, 12:54 PM   Clinical Narrative:   Pt has orders to discharge. CSW met with pt at bedside. Pt states she would like HH. No preference and would also like a RW. Jon with [facility] contacted to deliver RW to room. Adelina Mings with [facility] accepted referral.    Final next level of care: Home w Home Health Services Barriers to Discharge: Barriers Resolved   Patient Goals and CMS Choice   Choice offered to / list presented to : Patient  Discharge Placement                         Discharge Plan and Services Additional resources added to the After Visit Summary for                    DME Agency: [employer] Date DME Agency Contacted: 01/06/23   Representative spoke with at DME Agency: jon HH Arranged: PT, OT          Social Determinants of Health (SDOH) Interventions SDOH Screenings   Food Insecurity: No Food Insecurity (01/05/2023)  Housing: Patient Declined (01/05/2023)  Transportation Needs: No Transportation Needs (01/05/2023)  Utilities: Not At Risk (01/05/2023)  Financial Resource Strain: Low Risk  (12/14/2016)   Received from [facility], [facility]  Physical Activity: Sufficiently Active (12/14/2016)   Received from [facility], [facility]  Social Connections: Socially Integrated (12/14/2016)   Received from [facility], [facility]  Stress: No Stress Concern Present (12/14/2016)   Received from [facility], [facility]  Tobacco Use: Low Risk  (08/11/2022)     Readmission Risk Interventions     No data to display

## 2023-01-06 NOTE — Evaluation (Signed)
Physical Therapy Evaluation Patient Details Name: Paige Griffin MRN: [id number] DOB: 03-15-1945 Today's Date: 01/06/2023  History of Present Illness  This is a 77 yo female with a PMH of Essential HTN and HLD who presented to [facility] ER via EMS on 12/3 with acute onset of left-sided weakness.  She reports she was standing at the counter brushing her teeth around 2030 when she developed left-sided weakness.  Due to the weakness she fell on the floor and hit her head.  The fall caused bruising of the left arm along with a skin tear and bilateral knees.  She is not on any anticoagulants.  MRI reveals acute hemorrhage in the right basal ganglia and thalamus, which measures up to 1.5 cm, with an approximate volume of 2 mL.   Clinical Impression  Patient received up in recliner after OT session. She is agreeable to PT assessment. Patient stands with cga and RW. She is able to ambulate ~75 feet with RW and cga to min A for safety. Occasional instability of L LE with ambulation. (Knee buckling). She will continue to benefit from skilled PT to improve strength and safety with mobility for independence.         If plan is discharge home, recommend the following: A little help with walking and/or transfers;A little help with bathing/dressing/bathroom;Assistance with cooking/housework;Assist for transportation;Help with stairs or ramp for entrance   Can travel by private vehicle    yes    Equipment Recommendations Rolling walker (2 wheels)  Recommendations for Other Services       Functional Status Assessment Patient has had a recent decline in their functional status and demonstrates the ability to make significant improvements in function in a reasonable and predictable amount of time.     Precautions / Restrictions Precautions Precautions: Fall Restrictions Weight Bearing Restrictions: No      Mobility  Bed Mobility               General bed mobility comments: NT patient received  in recliner    Transfers Overall transfer level: Needs assistance Equipment used: Rolling walker (2 wheels) Transfers: Sit to/from Stand Sit to Stand: Contact guard assist                Ambulation/Gait Ambulation/Gait assistance: Contact guard assist Gait Distance (Feet): 75 Feet Assistive device: Rolling walker (2 wheels) Gait Pattern/deviations: Step-through pattern, Decreased step length - right, Decreased step length - left, Decreased stride length, Decreased dorsiflexion - left Gait velocity: decr     General Gait Details: occasional knee buckling on left with stance. Some imbalance noted. At increased fall risk.  Stairs            Wheelchair Mobility     Tilt Bed    Modified Rankin (Stroke Patients Only) Modified Rankin (Stroke Patients Only) Pre-Morbid Rankin Score: No symptoms Modified Rankin: Moderately severe disability     Balance Overall balance assessment: Needs assistance, History of Falls Sitting-balance support: Feet supported Sitting balance-Leahy Scale: Fair     Standing balance support: Bilateral upper extremity supported, During functional activity, Reliant on assistive device for balance Standing balance-Leahy Scale: Fair Standing balance comment: requires assistance for safety and RW                             Pertinent Vitals/Pain Pain Assessment Pain Assessment: No/denies pain    Home Living Family/patient expects to be discharged to:: Private residence Living Arrangements: Spouse/significant other  Available Help at Discharge: Family Type of Home: House Home Access: Stairs to enter Entrance Stairs-Rails: None Entrance Stairs-Number of Steps: 2   Home Layout: One level Home Equipment: None Additional Comments: Pt provides supervision for husband with Dementia; Husband able to perform ADL's IND.    Prior Function Prior Level of Function : Driving;Independent/Modified Independent             Mobility  Comments: No AD use at baseline ADLs Comments: Pt reports IND with all ADL's and IADL's     Extremity/Trunk Assessment   Upper Extremity Assessment Upper Extremity Assessment: Defer to OT evaluation LUE Deficits / Details: Weakness and LUE strength 3+/5 LUE Sensation: WNL LUE Coordination: decreased gross motor    Lower Extremity Assessment Lower Extremity Assessment: LLE deficits/detail LLE Deficits / Details: L LE weakness and occasional knee buckling during gait. LLE Coordination: decreased gross motor    Cervical / Trunk Assessment Cervical / Trunk Assessment: Normal  Communication   Communication Communication: No apparent difficulties Cueing Techniques: Verbal cues;Gestural cues  Cognition Arousal: Alert Behavior During Therapy: WFL for tasks assessed/performed Overall Cognitive Status: Within Functional Limits for tasks assessed                                          General Comments      Exercises     Assessment/Plan    PT Assessment Patient needs continued PT services  PT Problem List Decreased strength;Decreased mobility;Decreased coordination;Decreased activity tolerance;Decreased balance;Decreased knowledge of use of DME;Decreased range of motion       PT Treatment Interventions DME instruction;Therapeutic exercise;Gait training;Stair training;Functional mobility training;Therapeutic activities;Patient/family education;Neuromuscular re-education;Balance training    PT Goals (Current goals can be found in the Care Plan section)  Acute Rehab PT Goals Patient Stated Goal: to return home PT Goal Formulation: With patient Time For Goal Achievement: 01/20/23 Potential to Achieve Goals: Good    Frequency 7X/week     Co-evaluation               AM-PAC PT "6 Clicks" Mobility  Outcome Measure Help needed turning from your back to your side while in a flat bed without using bedrails?: None Help needed moving from lying on your  back to sitting on the side of a flat bed without using bedrails?: A Little Help needed moving to and from a bed to a chair (including a wheelchair)?: A Little Help needed standing up from a chair using your arms (e.g., wheelchair or bedside chair)?: A Little Help needed to walk in hospital room?: A Little Help needed climbing 3-5 steps with a railing? : A Little 6 Click Score: 19    End of Session Equipment Utilized During Treatment: Gait belt Activity Tolerance: Patient tolerated treatment well Patient left: in chair;with call bell/phone within reach Nurse Communication: Mobility status PT Visit Diagnosis: Unsteadiness on feet (R26.81);Other abnormalities of gait and mobility (R26.89);Muscle weakness (generalized) (M62.81);Difficulty in walking, not elsewhere classified (R26.2);History of falling (Z91.81);Hemiplegia and hemiparesis Hemiplegia - Right/Left: Left Hemiplegia - dominant/non-dominant: Non-dominant Hemiplegia - caused by: Cerebral infarction    Time: 0910-0920 PT Time Calculation (min) (ACUTE ONLY): 10 min   Charges:   PT Evaluation $PT Eval Moderate Complexity: 1 Mod   PT General Charges $$ ACUTE PT VISIT: 1 Visit         Erland Vivas, PT, GCS 01/06/23,11:18 AM

## 2023-01-06 NOTE — Evaluation (Signed)
Occupational Therapy Evaluation Patient Details Name: Paige Griffin MRN: [id number] DOB: [birth year] Today's Date: 01/06/2023   History of Present Illness This is a 77 yo female with a PMH of Essential HTN and HLD who presented to [facility] ER via EMS on 12/3 with acute onset of left-sided weakness.  She reports she was standing at the counter brushing her teeth around 2030 when she developed left-sided weakness.  Due to the weakness she fell on the floor and hit her head.  The fall caused bruising of the left arm along with a skin tear and bilateral knees.  She is not on any anticoagulants.  MRI reveals acute hemorrhage in the right basal ganglia and thalamus, which measures up to 1.5 cm, with an approximate volume of 2 mL.   Clinical Impression   Pt was seen for OT evaluation this date. Prior to hospital admission, pt was IND with all ADL's and IADL's. Pt lives with husband who has dementia, pt provides 24/7 supervision for husband. Pt presents to acute OT demonstrating impaired ADL performance and functional mobility 2/2 (See OT problem list for additional functional deficits). Upon arrival to room, pt supine in bed, agreeable to tx. Pt completed sup>sit with supervision. Pt incontinent in bed, and seemed unaware. Pt changed into clean gown and clean sheets. Pt completed donning/doffing socks with CGA while seated at the EOB. Pt completed MMT and demonstrated B AROM in all planes of movement. Noted pt to have 4-/5 strength on the LUE, 4/5 L grip, and decreased gross motor coordination.   Pt completed STSx2 with CGA; First attempt pt had LOB and quickly sat back down. Pt reported feeling dizzy, pt monitored t/o session. On second attempt, pt took a few side steps and steps forward and backward with CGA and AD at this time. Pt ambulated to sink in room and completed oral care in standing with CGA +RW, for stability. Pt noted to have L foot drop. Pt ambulated in the hall and back to room ~50 ft with  CGA+RW. Pt returned to the chair. Pt left in chair with call bell within reach and all needs met. Initial supervision on return home. Pt would benefit from skilled OT services to address noted impairments and functional limitations (see below for any additional details) in order to maximize safety and independence while minimizing falls risk and caregiver burden. Anticipate the need for follow up OT services upon acute hospital DC.        If plan is discharge home, recommend the following: A little help with walking and/or transfers;A little help with bathing/dressing/bathroom;Assist for transportation;Assistance with cooking/housework    Functional Status Assessment  Patient has had a recent decline in their functional status and demonstrates the ability to make significant improvements in function in a reasonable and predictable amount of time.  Equipment Recommendations  Other (comment) (Defer to next venue of care)    Recommendations for Other Services       Precautions / Restrictions Precautions Precautions: Fall Restrictions Weight Bearing Restrictions: No      Mobility Bed Mobility Overal bed mobility: Needs Assistance Bed Mobility: Supine to Sit     Supine to sit: Supervision       Patient Response: Cooperative  Transfers Overall transfer level: Needs assistance Equipment used: Rolling walker (2 wheels), 1 person hand held assist Transfers: Sit to/from Stand Sit to Stand: Contact guard assist                  Balance Overall  balance assessment: Needs assistance Sitting-balance support: Single extremity supported, Feet supported Sitting balance-Leahy Scale: Fair     Standing balance support: Bilateral upper extremity supported, During functional activity, Reliant on assistive device for balance Standing balance-Leahy Scale: Fair                             ADL either performed or assessed with clinical judgement   ADL Overall ADL's : Needs  assistance/impaired                                     Functional mobility during ADLs: Contact guard assist;Rolling walker (2 wheels);Cueing for safety General ADL Comments: Pt completed donning/doffing socks with CGA while seated at the EOB. Pt completed MMT and demonstrated B AROM in all planes of movement. Noted pt to have 3+/5 strength on the LUE and decreased gross motor coordination. Pt completed STSx2 with CGA;First attempt pt had LOB and quickly sat back down. Pt reported feeling dizzy, pt monitored t/o session. On second attempt, pt took a few side steps and steps forward and backward with CGA. Pt ambulated to sink in room and completed oral care in standing with CGA +RW, for stability. Pt noted to have L foot drop. Pt ambulated in the hall and back to room ~50 ft with CGA+RW.     Vision         Perception         Praxis         Pertinent Vitals/Pain Pain Assessment Pain Assessment: No/denies pain     Extremity/Trunk Assessment Upper Extremity Assessment Upper Extremity Assessment: Defer to OT evaluation LUE Deficits / Details: Weakness and LUE strength 3+/5 LUE Sensation: WNL LUE Coordination: decreased gross motor   Lower Extremity Assessment Lower Extremity Assessment: LLE deficits/detail LLE Deficits / Details: L LE weakness and occasional knee buckling during gait. LLE Coordination: decreased gross motor   Cervical / Trunk Assessment Cervical / Trunk Assessment: Normal   Communication Communication Communication: No apparent difficulties Cueing Techniques: Verbal cues;Gestural cues   Cognition Arousal: Alert Behavior During Therapy: WFL for tasks assessed/performed Overall Cognitive Status: Within Functional Limits for tasks assessed                                       General Comments       Exercises     Shoulder Instructions      Home Living Family/patient expects to be discharged to:: Private  residence Living Arrangements: Spouse/significant other Available Help at Discharge: Family Type of Home: House Home Access: Stairs to enter Secretary/administrator of Steps: 2 Entrance Stairs-Rails: None Home Layout: One level     Bathroom Shower/Tub: Chief Strategy Officer: Standard     Home Equipment: None   Additional Comments: Pt provides supervision for husband with Dementia; Husband able to perform ADL's IND.      Prior Functioning/Environment Prior Level of Function : Driving;Independent/Modified Independent             Mobility Comments: No AD use at baseline ADLs Comments: Pt reports IND with all ADL's and IADL's        OT Problem List: Decreased strength;Decreased range of motion;Decreased safety awareness;Decreased activity tolerance;Impaired balance (sitting and/or standing);Decreased coordination;Decreased knowledge of use of DME  or AE      OT Treatment/Interventions: Self-care/ADL training;Therapeutic exercise;Therapeutic activities;Neuromuscular education;Energy conservation;DME and/or AE instruction;Patient/family education    OT Goals(Current goals can be found in the care plan section) Acute Rehab OT Goals Patient Stated Goal: to go home OT Goal Formulation: With patient Time For Goal Achievement: 01/20/23 Potential to Achieve Goals: Good ADL Goals Pt Will Perform Grooming: standing;with modified independence Pt Will Perform Lower Body Dressing: with modified independence;sit to/from stand;sitting/lateral leans Pt Will Transfer to Toilet: regular height toilet;ambulating;with supervision  OT Frequency: Min 1X/week    Co-evaluation              AM-PAC OT "6 Clicks" Daily Activity     Outcome Measure Help from another person eating meals?: None Help from another person taking care of personal grooming?: A Little Help from another person toileting, which includes using toliet, bedpan, or urinal?: A Little Help from another person  bathing (including washing, rinsing, drying)?: A Lot Help from another person to put on and taking off regular upper body clothing?: A Little Help from another person to put on and taking off regular lower body clothing?: A Little 6 Click Score: 18   End of Session Equipment Utilized During Treatment: Rolling walker (2 wheels);Gait belt Nurse Communication: Mobility status  Activity Tolerance: Patient tolerated treatment well Patient left: in chair;with call bell/phone within reach  OT Visit Diagnosis: Unsteadiness on feet (R26.81);Other abnormalities of gait and mobility (R26.89);Muscle weakness (generalized) (M62.81);History of falling (Z91.81)                Time: 6440-3474 OT Time Calculation (min): 21 min Charges:     Butch Penny, SOT

## 2023-01-06 NOTE — TOC Progression Note (Signed)
Transition of Care [facility]) - Progression Note    Patient Details  Name: Paige Griffin MRN: [id number] Date of Birth: 07/30/1945  Transition of [facility]) CM/SW Contact  Allena Katz, LCSW Phone Number: 01/06/2023, 2:42 PM  Clinical Narrative:   CSW spoke with patients granddaughter. Granddaughter reports that pt takes care of her grandfather with dementia. She has been sitting with him today but states she is going to see if she can get someone else to sit with him while she comes to pick patient up. Explained to granddaughter recommendations for [facility] and RW. Granddaughter verbalized understanding. Granddaughter expressed concerns about patient returning home if the recs were rehab as patient was adamant she was returning home. CSW had shared with her the recommendations just had come in that it was Home health. CSW spoke with pt who states she does not feel she has any additional needs at home and feels good about returning home with a walker and [facility]    Expected Discharge Plan: Skilled Nursing Facility Barriers to Discharge: Barriers Resolved  Expected Discharge Plan and Services         Expected Discharge Date: 01/06/23                 DME Agency: [employer] Date DME Agency Contacted: 01/06/23   Representative spoke with at DME Agency: jon HH Arranged: PT, OT           Social Determinants of Health (SDOH) Interventions SDOH Screenings   Food Insecurity: No Food Insecurity (01/05/2023)  Housing: Patient Declined (01/05/2023)  Transportation Needs: No Transportation Needs (01/05/2023)  Utilities: Not At Risk (01/05/2023)  Financial Resource Strain: Low Risk  (12/14/2016)   Received from [facility], [facility]  Physical Activity: Sufficiently Active (12/14/2016)   Received from [facility], [facility]  Social Connections: Socially Integrated (12/14/2016)   Received from [facility], [facility]  Stress: No Stress Concern Present (12/14/2016)   Received from [facility], [facility]  Tobacco Use: Low Risk  (08/11/2022)    Readmission Risk Interventions     No data to display

## 2023-01-06 NOTE — Progress Notes (Signed)
SLP Cancellation Note  Patient Details Name: Paige Griffin MRN: [id number] DOB: 24-Jun-1945   Cancelled treatment:       Reason Eval/Treat Not Completed: SLP screened, no needs identified, will sign off (chart reviewed; consulted NSG and met w/ pt)   Pt denied any difficulty swallowing and is currently on a regular diet; tolerates swallowing pills w/ water per NSG. Pt now has secure upper Denture partial for effective mastication and biting of foods.  Pt conversed in conversation w/out expressive/receptive deficits noted; pt denied any speech-language deficits. Speech clear. NSG indicated pt was making all wants/needs known appropriately. Provided menu to pt for choosing foods; pt read aloud several options. Discussed w/ pt that wearing secure Denture partials allows for much improved articulation of speech, as well as chewing foods. Pt agreed. Adhesive present at bedside for pt's use. No further skilled ST services indicated as pt appears at her communication baseline. Pt agreed. NSG to reconsult if any change in status while admitted.      Jerilynn Som, MS, CCC-SLP Speech Language Pathologist Rehab Services; [facility] [phone] (ascom) Dondrell Loudermilk 01/06/2023, 10:42 AM

## 2023-01-06 NOTE — Progress Notes (Signed)
Neurology note re: timing of outpatient bronch  Patient is admitted for 2mL acute R basal ganglia hemorrhage. Etiology is hypertensive. She has a pulmonary mass suspicious for malignancy and neurology has been asked to give guidance regarding when it would be safe for her to undergo bronchoscopy under general anesthesia. Neurology recommends that bronchoscopy be performed in approximately 2 weeks. This is a reasonable balance between the risk of further hemorrhage and the risk of delaying cancer diagnosis and treatment. Recommendations communicated to primary team.  Bing Neighbors, MD [facility] [phone]  If 7pm- 7am, please page neurology on call as listed in AMION.

## 2023-01-06 NOTE — Plan of Care (Signed)
  Problem: Education: Goal: Knowledge of disease or condition will improve Outcome: Progressing   Problem: Coping: Goal: Will verbalize positive feelings about self Outcome: Progressing   Problem: Health Behavior/Discharge Planning: Goal: Ability to manage health-related needs will improve Outcome: Progressing   Problem: Self-Care: Goal: Verbalization of feelings and concerns over difficulty with self-care will improve Outcome: Progressing Goal: Ability to communicate needs accurately will improve Outcome: Progressing

## 2023-01-06 NOTE — Discharge Summary (Addendum)
Physician Discharge Summary   Patient: Paige Griffin MRN: [id number] DOB: 07-06-1945  Admit date:     01/04/2023  Discharge date: 01/06/23  Discharge Physician: Loyce Dys   PCP: Luciana Axe, NP   Recommendations at discharge:  Outpatient follow-up with pulmonologist  Discharge Diagnoses:  Left-sided weakness  Right basal ganglia hemorrhage likely secondary to hypertension  Right cavernous ICA tiny aneurysm per neuro likely not related to the hemorrhage  Query underlying mass lesion in the setting of lung nodule Essential Hypertension Hyperlipidemia Right Lung Nodule  Hospital Course: This is a 77 yo female with a PMH of Essential HTN and HLD who presented to [facility] ER via EMS on 12/3 with acute onset of left-sided weakness.  She reports she was standing at the counter brushing her teeth around 2030 when she developed left-sided weakness.  Due to the weakness she fell on the floor and hit her head.  The fall caused bruising of the left arm along with a skin tear and bilateral knees.  She is not on any anticoagulants.  Per ED notes EMS reported pt had a negative stroke screen upon their assessment.   Upon arrival to the pt continued to c/o left upper and lower extremity weakness, however worse in the left upper extremity.  Lab results were within normal limits.  CT Head revealed an acute hemorrhage in the right basal ganglia and thalamus.  On call neurologist Dr. Selina Cooley contacted by EDP and recommended obtaining CTA Head to evaluate for vascular abnormalities.  CTA Head negative for vascular malformation or active extravasation, but concerning for 1-2 mm medially projecting outpouching from the right cavernous ICA may represent a tiny aneurysm.  EDP discussed results with on call neurologist Dr. Selina Cooley who recommended admission to [facility] ICU with no indications to transfer to [facility].  Dr. Selina Cooley also recommended starting cleviprex gtt for goal sbp 130-150.  PCCM team contacted for  ICU admission.     CT Head/Cervical Spine:  Acute hemorrhage in the right basal ganglia and thalamus, which measures up to 1.5 cm, with an approximate volume of 2 mL. Surrounding edema without significant mass effect or midline shift. No acute fracture or traumatic listhesis in the cervical spine. Right upper lobe mass, which measures up to 2.0 cm, concerning for malignancy. Patient was seen by PT OT and currently walking around with no complaints Pulmonologist is planning outpatient evaluation and possible biopsy. Patient cleared for discharge today by neurologist and will follow-up as an outpatient  Consultants: Neurology, intensivist Procedures performed: As mentioned above Disposition: Home Diet recommendation:  Cardiac diet DISCHARGE MEDICATION: Allergies as of 01/06/2023   No Known Allergies      Medication List     TAKE these medications    acetaminophen 325 MG tablet Commonly known as: TYLENOL Take 2 tablets (650 mg total) by mouth every 4 (four) hours as needed for mild pain (pain score 1-3) (or temp > 37.5 C (99.5 F)).   alendronate 70 MG tablet Commonly known as: FOSAMAX Take 70 mg by mouth once a week.   Cholecalciferol 50 MCG (2000 UT) Caps Take by mouth.   cyanocobalamin 1000 MCG tablet Take 2 tablets daily for 2 weeks, then reduce to 1 tablet daily thereafter for Vitamin B12 Deficiency.   losartan 25 MG tablet Commonly known as: COZAAR Take 25 mg by mouth daily.   nitrofurantoin (macrocrystal-monohydrate) 100 MG capsule Commonly known as: MACROBID Take 1 capsule (100 mg total) by mouth 2 (two) times daily  for 5 days.   pravastatin 20 MG tablet Commonly known as: PRAVACHOL Take 20 mg by mouth at bedtime.               Durable Medical Equipment  (From admission, onward)           Start     Ordered   01/06/23 1255  For home use only DME Walker rolling  Once       Question Answer Comment  Walker: With 5 Inch Wheels   Patient needs a walker  to treat with the following condition Ambulatory dysfunction      01/06/23 1254            Follow-up Information     Erin Fulling, MD Follow up.   Specialties: Pulmonary Disease, Cardiology Contact information: [address] [place] [address] [phone]                Discharge Exam: Ceasar Mons Weights   01/04/23 1258  Weight: 52.2 kg   General: Acutely-ill appearing female, NAD resting in bed on RA  HENT: Supple, no JVD  Lungs: Clear throughout, even, non labored  Cardiovascular: NSR, s1s2, no m/r/g, 2+ radial/2+ distal pulses, no edema  Abdomen: +BS x4, obese, soft, non tender, non distended  Extremities: Normal bulk and tone, no edema  Neuro: Alert/oriented, following commands, LUE/LLE mild drift, PERRLA, bilateral upper/lower motor strength GU: Deferred  Condition at discharge: good  The results of significant diagnostics from this hospitalization (including imaging, microbiology, ancillary and laboratory) are listed below for reference.   Imaging Studies: ECHOCARDIOGRAM COMPLETE  Result Date: 01/05/2023    ECHOCARDIOGRAM REPORT   Patient Name:   [name] Date of Exam: 01/05/2023 Medical Rec #:  [id number]             Height:       64.0 in Accession #:    [id number]            Weight:       115.0 lb Date of Birth:  02/14/1945              BSA:          1.546 m Patient Age:    77 years              BP:           138/83 mmHg Patient Gender: F                     HR:           75 bpm. Exam Location:  [facility] Procedure: 2D Echo, Cardiac Doppler and Color Doppler Indications:     Stroke  History:         Patient has no prior history of Echocardiogram examinations.                  Stroke; Risk Factors:Hypertension.  Sonographer:     Mikki Harbor Referring Phys:  [id number] Ezequiel Essex Diagnosing Phys: Julien Nordmann MD  Sonographer Comments: Image acquisition challenging due to respiratory motion. IMPRESSIONS  1. Left ventricular ejection  fraction, by estimation, is 60 to 65%. The left ventricle has normal function. The left ventricle has no regional wall motion abnormalities. There is mild left ventricular hypertrophy. Left ventricular diastolic parameters are consistent with Grade I diastolic dysfunction (impaired relaxation).  2. Right ventricular systolic function is normal. The right ventricular size is normal. There is normal pulmonary  artery systolic pressure. The estimated right ventricular systolic pressure is 23.8 mmHg.  3. The mitral valve is normal in structure. Mild mitral valve regurgitation. No evidence of mitral stenosis.  4. The aortic valve is tricuspid. Aortic valve regurgitation is mild. Aortic valve sclerosis/calcification is present, without any evidence of aortic stenosis.  5. The inferior vena cava is normal in size with greater than 50% respiratory variability, suggesting right atrial pressure of 3 mmHg. FINDINGS  Left Ventricle: Left ventricular ejection fraction, by estimation, is 60 to 65%. The left ventricle has normal function. The left ventricle has no regional wall motion abnormalities. The left ventricular internal cavity size was normal in size. There is  mild left ventricular hypertrophy. Left ventricular diastolic parameters are consistent with Grade I diastolic dysfunction (impaired relaxation). Right Ventricle: The right ventricular size is normal. No increase in right ventricular wall thickness. Right ventricular systolic function is normal. There is normal pulmonary artery systolic pressure. The tricuspid regurgitant velocity is 2.28 m/s, and  with an assumed right atrial pressure of 3 mmHg, the estimated right ventricular systolic pressure is 23.8 mmHg. Left Atrium: Left atrial size was normal in size. Right Atrium: Right atrial size was normal in size. Pericardium: There is no evidence of pericardial effusion. Mitral Valve: The mitral valve is normal in structure. There is mild calcification of the mitral  valve leaflet(s). Mild mitral valve regurgitation. No evidence of mitral valve stenosis. MV peak gradient, 4.2 mmHg. The mean mitral valve gradient is 2.0 mmHg. Tricuspid Valve: The tricuspid valve is normal in structure. Tricuspid valve regurgitation is mild . No evidence of tricuspid stenosis. Aortic Valve: The aortic valve is tricuspid. Aortic valve regurgitation is mild. Aortic valve sclerosis/calcification is present, without any evidence of aortic stenosis. Aortic valve mean gradient measures 4.0 mmHg. Aortic valve peak gradient measures 8.8 mmHg. Aortic valve area, by VTI measures 2.51 cm. Pulmonic Valve: The pulmonic valve was normal in structure. Pulmonic valve regurgitation is mild. No evidence of pulmonic stenosis. Aorta: The aortic root is normal in size and structure. Venous: The inferior vena cava is normal in size with greater than 50% respiratory variability, suggesting right atrial pressure of 3 mmHg. IAS/Shunts: No atrial level shunt detected by color flow Doppler.  LEFT VENTRICLE PLAX 2D LVIDd:         3.80 cm   Diastology LVIDs:         2.80 cm   LV e' medial:    3.92 cm/s LV PW:         1.10 cm   LV E/e' medial:  17.5 LV IVS:        1.10 cm   LV e' lateral:   7.62 cm/s LVOT diam:     2.00 cm   LV E/e' lateral: 9.0 LV SV:         77 LV SV Index:   50 LVOT Area:     3.14 cm  RIGHT VENTRICLE RV Basal diam:  3.80 cm RV Mid diam:    3.00 cm RV S prime:     12.60 cm/s TAPSE (M-mode): 2.3 cm LEFT ATRIUM             Index        RIGHT ATRIUM           Index LA diam:        3.00 cm 1.94 cm/m   RA Area:     17.60 cm LA Vol (A2C):   54.7 ml 35.37 ml/m  RA Volume:   53.30 ml  34.47 ml/m LA Vol (A4C):   49.3 ml 31.88 ml/m LA Biplane Vol: 53.6 ml 34.66 ml/m  AORTIC VALVE                    PULMONIC VALVE AV Area (Vmax):    2.63 cm     PV Vmax:       0.97 m/s AV Area (Vmean):   2.69 cm     PV Peak grad:  3.8 mmHg AV Area (VTI):     2.51 cm AV Vmax:           148.00 cm/s AV Vmean:          96.600  cm/s AV VTI:            0.308 m AV Peak Grad:      8.8 mmHg AV Mean Grad:      4.0 mmHg LVOT Vmax:         124.00 cm/s LVOT Vmean:        82.700 cm/s LVOT VTI:          0.246 m LVOT/AV VTI ratio: 0.80  AORTA Ao Root diam: 3.60 cm Ao Asc diam:  3.20 cm MITRAL VALVE               TRICUSPID VALVE MV Area (PHT): 3.13 cm    TR Peak grad:   20.8 mmHg MV Area VTI:   2.54 cm    TR Vmax:        228.00 cm/s MV Peak grad:  4.2 mmHg MV Mean grad:  2.0 mmHg    SHUNTS MV Vmax:       1.02 m/s    Systemic VTI:  0.25 m MV Vmean:      60.6 cm/s   Systemic Diam: 2.00 cm MV Decel Time: 242 msec MV E velocity: 68.60 cm/s MV A velocity: 89.90 cm/s MV E/A ratio:  0.76 Julien Nordmann MD Electronically signed by Julien Nordmann MD Signature Date/Time: 01/05/2023/4:25:35 PM    Final    MR BRAIN W WO CONTRAST  Result Date: 01/05/2023 CLINICAL DATA:  Provided history: Stroke, follow-up. Rule out underlying metastatic lesion. EXAM: Prior head CT TECHNIQUE: Multiplanar, multiecho pulse sequences of the brain and surrounding structures were obtained without and with intravenous contrast. CONTRAST:  5mL GADAVIST GADOBUTROL 1 MMOL/ML IV SOLN COMPARISON:  Examinations 01/04/2023.  CT angiogram head 01/04/2023. FINDINGS: Brain: Mild generalized cerebral atrophy. An acute parenchymal hemorrhage centered at the right thalamus, posterior limb of internal capsule and corona radiata is unchanged in volume from the head CT examinations performed 01/04/2023. Mild-to-moderate surrounding edema. The presence of pre-contrast T1 hyperintense hemorrhage at this site limits evaluation for pathologic enhancement on post-contrast imaging. Within this limitation, no masslike or nodular enhancement is appreciated at site of the hemorrhage. Background mild multifocal T2 FLAIR hyperintense signal abnormality within the cerebral white matter, nonspecific but compatible chronic small vessel ischemic disease. No extra-axial fluid collection. No midline shift.  Vascular: Maintained flow voids within the proximal large arterial vessels. Skull and upper cervical spine: No focal worrisome marrow lesion. Incompletely assessed cervical spondylosis. Sinuses/Orbits: No mass or acute finding within the imaged orbits. Mild mucosal thickening within the bilateral ethmoid sinuses. IMPRESSION: 1. An acute parenchymal hemorrhage centered at the right thalamus, posterior limb of internal capsule and corona radiata is unchanged in volume from the head CT examinations of 01/04/2023. Mild-to-moderate surrounding edema. The presence of hemorrhage limits evaluation for pathologic enhancement  at this site. Within this limitation, no masslike or nodular enhancement is appreciated. This could reflect a hypertensive hemorrhage. However, a follow-up brain MRI (within and without contrast) is recommended once the hemorrhage resolves to more definitively exclude an underlying metastasis. 2. Background mild cerebral white matter chronic small vessel ischemic disease. 3. Mild cerebral atrophy. Electronically Signed   By: Jackey Loge D.O.   On: 01/05/2023 10:43   CT CHEST WO CONTRAST  Addendum Date: 01/04/2023   ADDENDUM REPORT: 01/04/2023 23:08 ADDENDUM: 4.2 cm ascending thoracic aortic aneurysm. Recommend annual imaging followup by CTA or MRA. This recommendation follows 2010 ACCF/AHA/AATS/ACR/ASA/SCA/SCAI/SIR/STS/SVM Guidelines for the Diagnosis and Management of Patients with Thoracic Aortic Disease. Circulation. 2010; 121: Z610-R604. Aortic aneurysm NOS (ICD10-I71.9) Electronically Signed   By: Burman Nieves M.D.   On: 01/04/2023 23:08   Result Date: 01/04/2023 CLINICAL DATA:  Greater than 8 mm lung nodule for follow-up. EXAM: CT CHEST WITHOUT CONTRAST TECHNIQUE: Multidetector CT imaging of the chest was performed following the standard protocol without IV contrast. RADIATION DOSE REDUCTION: This exam was performed according to the departmental dose-optimization program which includes  automated exposure control, adjustment of the mA and/or kV according to patient size and/or use of iterative reconstruction technique. COMPARISON:  CT cervical spine 01/04/2023 FINDINGS: Cardiovascular: Normal heart size. Trace pericardial effusion. Calcification of the aorta and coronary arteries. Ascending thoracic aortic aneurysm measuring 4.2 cm AP diameter. Mediastinum/Nodes: Thyroid gland is unremarkable. Esophagus is decompressed. No significant lymphadenopathy. Lungs/Pleura: As seen on the prior study, there is a 1.8 x 2.2 cm diameter spiculated nodule in the right lung apex. Spiculations extend to the apical pleural surface. Appearance is suspicious for malignancy. Diffuse emphysematous changes in the lungs with scattered fibrosis. No additional pulmonary nodules. No pleural effusion or pneumothorax. Upper Abdomen: No acute abnormalities demonstrated. Musculoskeletal: Degenerative changes in the spine. No focal bone lesions. IMPRESSION: 1. 1.8 x 2.2 cm spiculated nodule in the right apex, suspicious for malignancy. Recommend follow-up with PET-CT and/or tissue sampling. 2. Diffuse emphysematous changes throughout the lungs. Scattered fibrosis. 3. No significant lymphadenopathy. 4. Aortic atherosclerosis. Electronically Signed: By: Burman Nieves M.D. On: 01/04/2023 22:42   CT HEAD WO CONTRAST ( )  Result Date: 01/04/2023 CLINICAL DATA:  Intracranial hemorrhage follow EXAM: CT HEAD WITHOUT CONTRAST TECHNIQUE: Contiguous axial images were obtained from the base of the skull through the vertex without intravenous contrast. RADIATION DOSE REDUCTION: This exam was performed according to the departmental dose-optimization program which includes automated exposure control, adjustment of the mA and/or kV according to patient size and/or use of iterative reconstruction technique. COMPARISON:  2:47 p.m. FINDINGS: Brain: Unchanged appearance of right thalamic/basal ganglia intraparenchymal hematoma. No midline  shift or other mass effect. Brain parenchyma is otherwise unremarkable aside from mild chronic microvascular changes. Vascular: Negative Skull: Negative Sinuses/Orbits: Paranasal sinuses are clear. No mastoid effusion. Normal orbits. Other: None IMPRESSION: Unchanged appearance of right thalamic/basal ganglia intraparenchymal hematoma. No midline shift or other mass effect. Electronically Signed   By: Deatra Robinson M.D.   On: 01/04/2023 22:52   CT Angio Head W or Wo Contrast  Result Date: 01/04/2023 CLINICAL DATA:  Hemorrhagic stroke, evaluate for vascular abnormalities. EXAM: CT ANGIOGRAPHY HEAD TECHNIQUE: Multidetector CT imaging of the head was performed using the standard protocol during bolus administration of intravenous contrast. Multiplanar CT image reconstructions and MIPs were obtained to evaluate the vascular anatomy. RADIATION DOSE REDUCTION: This exam was performed according to the departmental dose-optimization program which includes automated exposure control, adjustment of  the mA and/or kV according to patient size and/or use of iterative reconstruction technique. CONTRAST:  75mL OMNIPAQUE IOHEXOL 350 MG/ML SOLN COMPARISON:  01/04/2023 CT head, no prior CTA available FINDINGS: CT HEAD FINDINGS For noncontrast findings, please see same day CT head. No abnormal enhancement on postcontrast head CT. No evidence of active extravasation into the right basal ganglia/thalamus hemorrhage. CTA HEAD FINDINGS Anterior circulation: Both internal carotid arteries are patent to the termini, without significant stenosis. 1-2 mm medially projecting outpouching from the right cavernous ICA (series 6, image 105) may represent a tiny aneurysm. Inferomedial projection from the left supraclinoid ICA (series 6, image 100 and series 11, image 90) is favored to represent an infundibulum, possibly at the origin of a diminutive or stenosed left posterior communicating artery. Similar posterior projection from the right  supraclinoid ICA (series 6, image 95) is also favored to represent an infundibulum at the origin of the right posterior communicating artery. A1 segments patent. Normal anterior communicating artery. Anterior cerebral arteries are patent to their distal aspects without significant stenosis. No M1 stenosis or occlusion. MCA branches perfused to their distal aspects without significant stenosis. Posterior circulation: Vertebral arteries patent to the vertebrobasilar junction without significant stenosis. Posterior inferior cerebellar arteries patent proximally. Basilar patent to its distal aspect without significant stenosis. Superior cerebellar arteries patent proximally. Patent P1 segments. PCAs perfused to their distal aspects without significant stenosis. Venous sinuses: Not well opacified due to phase of timing. Anatomic variants: None significant. No evidence of  vascular malformation. Review of the MIP images confirms the above findings IMPRESSION: 1. No evidence of active extravasation into the right basal ganglia/thalamus hemorrhage. No evidence of vascular malformation. 2. No intracranial large vessel occlusion or significant stenosis. 3. 1-2 mm medially projecting outpouching from the right cavernous ICA may represent a tiny aneurysm. 4. Additional projections from the bilateral distal supraclinoid ICA are favored to represent infundibulla at the origins of the posterior communicating arteries. Electronically Signed   By: Wiliam Ke M.D.   On: 01/04/2023 16:50   DG Forearm Left  Result Date: 01/04/2023 CLINICAL DATA:  Left forearm pain after fall. EXAM: LEFT FOREARM - 2 VIEW COMPARISON:  None Available. FINDINGS: There is no evidence of fracture or other focal bone lesions. Soft tissues are unremarkable. IMPRESSION: Negative. Electronically Signed   By: Lupita Raider M.D.   On: 01/04/2023 15:51   DG Shoulder Left  Result Date: 01/04/2023 CLINICAL DATA:  Left shoulder pain after fall. EXAM: LEFT  SHOULDER - 2+ VIEW COMPARISON:  None Available. FINDINGS: There is no evidence of fracture or dislocation. There is no evidence of arthropathy or other focal bone abnormality. Soft tissues are unremarkable. IMPRESSION: Negative. Electronically Signed   By: Lupita Raider M.D.   On: 01/04/2023 15:49   CT Head Wo Contrast  Result Date: 01/04/2023 CLINICAL DATA:  Left-sided weakness EXAM: CT HEAD WITHOUT CONTRAST CT CERVICAL SPINE WITHOUT CONTRAST TECHNIQUE: Multidetector CT imaging of the head and cervical spine was performed following the standard protocol without intravenous contrast. Multiplanar CT image reconstructions of the cervical spine were also generated. RADIATION DOSE REDUCTION: This exam was performed according to the departmental dose-optimization program which includes automated exposure control, adjustment of the mA and/or kV according to patient size and/or use of iterative reconstruction technique. COMPARISON:  None available FINDINGS: CT HEAD FINDINGS Brain: Hyperdense hemorrhage in the right basal ganglia and thalamus, which measures up to 1.5 x 1.3 x 1.2 cm (AP x TR x CC) (series  4, image 25 and series 6, image 34), with an approximate volume of 2 mL. Surrounding hypodensity, consistent with edema. No evidence of intraventricular extension. No subarachnoid or subdural collection. No significant mass effect or midline shift. No evidence of acute infarct or hydrocephalus. Vascular: No hyperdense vessel. Skull: Negative for fracture or focal lesion. Sinuses/Orbits: Mucosal thickening in the ethmoid air cells. No acute finding in the orbits. Other: The mastoid air cells are well aerated. CT CERVICAL SPINE FINDINGS Alignment: No traumatic listhesis. Skull base and vertebrae: No acute fracture or suspicious osseous lesion. Soft tissues and spinal canal: No prevertebral fluid or swelling. No visible canal hematoma. Disc levels: Degenerative changes in the cervical spine.No high-grade spinal canal  stenosis. Upper chest: Right upper lobe mass, which measures up to 2.0 cm. No pleural effusion. Emphysema. IMPRESSION: 1. Acute hemorrhage in the right basal ganglia and thalamus, which measures up to 1.5 cm, with an approximate volume of 2 mL. Surrounding edema without significant mass effect or midline shift. 2. No acute fracture or traumatic listhesis in the cervical spine. 3. Right upper lobe mass, which measures up to 2.0 cm, concerning for malignancy. These findings were discussed by telephone on 01/04/2023 at 3:11 pm with provider [facility] . Electronically Signed   By: Wiliam Ke M.D.   On: 01/04/2023 15:11   CT Cervical Spine Wo Contrast  Result Date: 01/04/2023 CLINICAL DATA:  Left-sided weakness EXAM: CT HEAD WITHOUT CONTRAST CT CERVICAL SPINE WITHOUT CONTRAST TECHNIQUE: Multidetector CT imaging of the head and cervical spine was performed following the standard protocol without intravenous contrast. Multiplanar CT image reconstructions of the cervical spine were also generated. RADIATION DOSE REDUCTION: This exam was performed according to the departmental dose-optimization program which includes automated exposure control, adjustment of the mA and/or kV according to patient size and/or use of iterative reconstruction technique. COMPARISON:  None available FINDINGS: CT HEAD FINDINGS Brain: Hyperdense hemorrhage in the right basal ganglia and thalamus, which measures up to 1.5 x 1.3 x 1.2 cm (AP x TR x CC) (series 4, image 25 and series 6, image 34), with an approximate volume of 2 mL. Surrounding hypodensity, consistent with edema. No evidence of intraventricular extension. No subarachnoid or subdural collection. No significant mass effect or midline shift. No evidence of acute infarct or hydrocephalus. Vascular: No hyperdense vessel. Skull: Negative for fracture or focal lesion. Sinuses/Orbits: Mucosal thickening in the ethmoid air cells. No acute finding in the orbits. Other: The mastoid air  cells are well aerated. CT CERVICAL SPINE FINDINGS Alignment: No traumatic listhesis. Skull base and vertebrae: No acute fracture or suspicious osseous lesion. Soft tissues and spinal canal: No prevertebral fluid or swelling. No visible canal hematoma. Disc levels: Degenerative changes in the cervical spine.No high-grade spinal canal stenosis. Upper chest: Right upper lobe mass, which measures up to 2.0 cm. No pleural effusion. Emphysema. IMPRESSION: 1. Acute hemorrhage in the right basal ganglia and thalamus, which measures up to 1.5 cm, with an approximate volume of 2 mL. Surrounding edema without significant mass effect or midline shift. 2. No acute fracture or traumatic listhesis in the cervical spine. 3. Right upper lobe mass, which measures up to 2.0 cm, concerning for malignancy. These findings were discussed by telephone on 01/04/2023 at 3:11 pm with provider [facility] . Electronically Signed   By: Wiliam Ke M.D.   On: 01/04/2023 15:11    Microbiology: Results for orders placed or performed during the hospital encounter of 01/04/23  MRSA Next Gen by PCR,  Nasal     Status: None   Collection Time: 01/04/23  8:27 PM   Specimen: Nasal Mucosa; Nasal Swab  Result Value Ref Range Status   MRSA by PCR Next Gen NOT DETECTED NOT DETECTED Final    Comment: (NOTE) The GeneXpert MRSA Assay (FDA approved for NASAL specimens only), is one component of a comprehensive MRSA colonization surveillance program. It is not intended to diagnose MRSA infection nor to guide or monitor treatment for MRSA infections. Test performance is not FDA approved in patients less than 33 years old. Performed at [facility], [address] [place], [place] [address]     Labs: CBC: Recent Labs  Lab 01/04/23 1305 01/05/23 0443 01/06/23 0358  WBC 9.3 8.2 8.4  HGB 13.8 12.9 12.7  HCT 39.3 37.0 36.6  MCV 87.1 88.1 88.0  PLT 237 205 192   Basic Metabolic Panel: Recent Labs  Lab 01/04/23 1305  01/05/23 0443 01/06/23 0358  NA 136 135 137  K 3.7 3.2* 3.7  CL 104 103 107  CO2 23 24 25   GLUCOSE 134* 97 95  BUN 10 16 16   CREATININE 0.70 0.82 0.67  CALCIUM 9.0 8.6* 8.9  MG  --  2.6* 2.3  PHOS  --  3.3 2.6   Liver Function Tests: Recent Labs  Lab 01/06/23 0358  ALBUMIN 3.7   CBG: Recent Labs  Lab 01/05/23 0339 01/05/23 0722 01/05/23 1129 01/05/23 1621 01/05/23 1938  GLUCAP 101* 89 170* 130* 92    Discharge time spent:  35 minutes.  Signed: Loyce Dys, MD [facility] 01/06/2023

## 2023-01-12 ENCOUNTER — Ambulatory Visit: Payer: PPO | Admitting: Pulmonary Disease

## 2023-01-12 ENCOUNTER — Encounter: Payer: Self-pay | Admitting: Pulmonary Disease

## 2023-01-12 VITALS — BP 180/90 | HR 75 | Temp 97.1°F | Ht 64.0 in | Wt 110.4 lb

## 2023-01-12 DIAGNOSIS — I10 Essential (primary) hypertension: Secondary | ICD-10-CM | POA: Diagnosis not present

## 2023-01-12 DIAGNOSIS — R911 Solitary pulmonary nodule: Secondary | ICD-10-CM

## 2023-01-12 DIAGNOSIS — I619 Nontraumatic intracerebral hemorrhage, unspecified: Secondary | ICD-10-CM

## 2023-01-12 NOTE — Patient Instructions (Signed)
VISIT SUMMARY:  During today's visit, we discussed the lung lesion identified on your recent imaging, your ongoing stroke recovery, and your recent unintentional weight loss. We also reviewed your general health and activity levels.  YOUR PLAN:  -PULMONARY NODULE: A pulmonary nodule is a small, round growth in the lung that can be benign or malignant. Given your history as a former smoker and the presence of scarring and emphysema, we need to further investigate this nodule. We will order a PET scan to assess its metabolic activity. If the nodule is active, we may consider a biopsy, but this will be delayed until your stroke recovery is sufficient for general anesthesia.  -STROKE RECOVERY: You are gradually recovering from your recent stroke, with no current neurological symptoms. Stress from caregiving may have contributed to the stroke. We will continue to monitor your recovery and delay any procedures requiring general anesthesia until you are sufficiently recovered to minimize risks.  -WEIGHT LOSS: You have experienced unintentional weight loss over the past few months, likely due to stress and inadequate nutrition from caregiving responsibilities. We discussed the impact of stress on your weight and health. We will monitor your weight and nutritional status and provide support and resources for stress management and caregiving.  -GENERAL HEALTH MAINTENANCE: You have no allergies, current respiratory symptoms, or leg swelling, and you remain active. We will continue to monitor your general health and activity levels.  INSTRUCTIONS:  Please schedule a PET scan as soon as possible. We will have a follow-up appointment after PET CT to discuss the results and determine next steps.

## 2023-01-12 NOTE — Progress Notes (Addendum)
Subjective:    Patient ID: Paige Griffin, female    DOB: [birth year], 77 y.o.   MRN: [id number]  Patient Care Team: Luciana Axe, NP as PCP - General [facility])  Chief Complaint  Patient presents with   Consult    Nodule. No SOB, wheezing or cough.    BACKGROUND: Patient is a 77 year old lifelong former light cigarette smoker who presents for evaluation of a right upper lobe lung nodule noted on CT chest performed 04 January 2023 during admission at [facility] for acute right basal ganglia hemorrhage.  This was due to hypertensive urgency.  Neurology recommended waiting at least 2 weeks before procedures are contemplated.  Patient presents today for discussion of next steps with regards to this finding.   HPI Discussed the use of AI scribe software for clinical note transcription with the patient, who gave verbal consent to proceed.  History of Present Illness   The patient, with a history of stroke, presents for evaluation of a lung lesion identified on recent imaging. She reports feeling stronger each day following the stroke and denies any respiratory symptoms such as cough, shortness of breath, or wheezing. She also denies chest pain, chest tightness, and leg swelling.  The patient has a past smoking history, consuming a pack of cigarettes over two weeks, with cessation approximately 25 years ago. She has been experiencing unintentional weight loss over the past few months, which she attributes to stress related to caring for her spouse with dementia.  The patient had previously had noted upper lobe abnormalities on chest x-rays performed at [facility] in 2001, which also showed some scarring.  However, these films are not available and the report mentions bilateral apical pleural thickening with a cyst or bleb like lucency seen at each apex.  The patient denies any prior knowledge of lung disease. She remains active despite her medical conditions.   She has worked in [employer]  work.  No military history.  I independently reviewed the available CT chest performed on 04 January 2023, films were also shown to the patient.  Representative images as noted below.      Review of Systems A 10 point review of systems was performed and it is as noted above otherwise negative.   Past Medical History:  Diagnosis Date   Cancer (HCC)    skin ca    Past Surgical History:  Procedure Laterality Date   ABDOMINAL HYSTERECTOMY     BREAST BIOPSY Right 10/12/2007   neg   BREAST EXCISIONAL BIOPSY Right     Patient Active Problem List   Diagnosis Date Noted   UTI (urinary tract infection) 01/06/2023   Hemorrhagic stroke (HCC) 01/04/2023   HTN (hypertension) 01/04/2023   Lung nodule 01/04/2023    Family History  Problem Relation Age of Onset   Breast cancer Other 2    Social History   Tobacco Use   Smoking status: Former    Current packs/day: 0.00    Types: Cigarettes    Quit date: 2020    Years since quitting: 4.9   Smokeless tobacco: Never   Tobacco comments:    Smoked 2-3 cigarettes daily  Substance Use Topics   Alcohol use: Never    No Known Allergies  Current Meds  Medication Sig   acetaminophen (TYLENOL) 325 MG tablet Take 2 tablets (650 mg total) by mouth every 4 (four) hours as needed for mild pain (pain score 1-3) (or temp > 37.5 C (99.5 F)).   Cholecalciferol 50  MCG (2000 UT) CAPS Take by mouth.   cyanocobalamin 1000 MCG tablet Take 2 tablets daily for 2 weeks, then reduce to 1 tablet daily thereafter for Vitamin B12 Deficiency.   pravastatin (PRAVACHOL) 20 MG tablet Take 20 mg by mouth at bedtime.    Immunization History  Administered Date(s) Administered   Influenza,inj,quad, With Preservative 11/08/2016, 11/07/2017   Influenza-Unspecified 11/02/2018, 11/03/2020, 11/09/2021, 11/02/2022   PFIZER Comirnaty(Gray Top)Covid-19 Tri-Sucrose Vaccine 04/04/2019, 04/25/2019, 11/08/2019, 06/16/2020   Pfizer Covid-19 Vaccine Bivalent Booster 64yrs  & up 01/05/2021   Pneumococcal Conjugate-13 12/15/2016   Pneumococcal Polysaccharide-23 12/25/2013   Respiratory Syncytial Virus Vaccine,Recomb Aduvanted(Arexvy) 01/13/2022   Tdap 12/25/2013   Unspecified SARS-COV-2 Vaccination 12/16/2021, 11/08/2022   Zoster Recombinant(Shingrix) 07/02/2021, 08/02/2022   Zoster, Live 11/08/2012        Objective:     BP (!) 180/90 (BP Location: Right Arm, Cuff Size: Normal)   Pulse 75   Temp (!) 97.1 F (36.2 C)   Ht 5\' 4"  (1.626 m)   Wt 110 lb 6.4 oz (50.1 kg)   SpO2 98%   BMI 18.95 kg/m   SpO2: 98 % O2 Device: None (Room air)  GENERAL: Well-developed, thin woman, no acute distress, fully ambulatory.  No conversational dyspnea HEAD: Normocephalic, atraumatic.  EYES: Pupils equal, round, reactive to light.  No scleral icterus.  MOUTH: Pharynx clear, no exudate.  Mucous membranes moist. NECK: Supple. No thyromegaly. Trachea midline. No JVD.  No adenopathy. PULMONARY: Good air entry bilaterally.  No adventitious sounds. CARDIOVASCULAR: S1 and S2. Regular rate and rhythm.  ABDOMEN: Benign. MUSCULOSKELETAL: No joint deformity, no clubbing, no edema.  NEUROLOGIC: Mild residual left-sided weakness, per patient improving.  No other deficits noted SKIN: Intact,warm,dry. PSYCH: Mood and behavior normal   Representative image from CT scan performed 04 January 2023 showing the nodule in question on the right upper lobe:       Assessment & Plan:     ICD-10-CM   1. Lung nodule  R91.1 NM PET Image Initial (PI) Skull Base To Thigh (F-18 FDG)    2. Hemorrhagic stroke (HCC)  I61.9     3. Hypertension, unspecified type  I10       Orders Placed This Encounter  Procedures   NM PET Image Initial (PI) Skull Base To Thigh (F-18 FDG)    Standing Status:   Future    Standing Expiration Date:   01/12/2024    Order Specific Question:   If indicated for the ordered procedure, I authorize the administration of a radiopharmaceutical per Radiology  protocol    Answer:   Yes    Order Specific Question:   Preferred imaging location?    Answer:   [facility]   Discussion:    Pulmonary Nodule Pulmonary nodule identified on imaging with associated scarring and emphysema. Differential diagnosis includes benign scarring, infection, or malignancy. Former smoker, quit 25 years ago. No current respiratory symptoms. PET scan recommended to assess metabolic activity. Biopsy to be considered if metabolically active, delayed until stroke recovery is sufficient for general anesthesia. - Order PET scan - Schedule follow-up appointment post-PET scan results  Stroke Recovery Recovering from recent stroke with gradual improvement in strength. No current neurological symptoms. Stress from caregiving for spouse with dementia may have contributed to stroke. Delay procedures requiring general anesthesia until recovery is sufficient to minimize risks. - Monitor stroke recovery - Delay procedures requiring general anesthesia until sufficient recovery - Blood pressure noted to be elevated today patient states that this is  due to "whitecoat syndrome" states usually    controlled at home, she monitors blood pressures at home.  Continue follow-up her primary provider Weight Loss Unintentional weight loss over past few months, likely due to stress and inadequate nutrition from caregiving responsibilities. Discussed impact of stress on weight and health. - Monitor weight and nutritional status - Provide support and resources for stress management and caregiving  General Health Maintenance No allergies. No current respiratory symptoms or leg swelling. Remains active. - Continue monitoring general health and activity levels  Follow-up - Schedule PET scan - Follow-up appointment after PET/CT done to discuss results and next steps.      Advised if symptoms do not improve or worsen, to please contact office for sooner follow up or seek emergency care.    I  spent 45 minutes of dedicated to the care of this patient on the date of this encounter to include pre-visit review of records, face-to-face time with the patient discussing conditions above, post visit ordering of testing, clinical documentation with the electronic health record, making appropriate referrals as documented, and communicating necessary findings to members of the patients care team.   C. Danice Goltz, MD Advanced Bronchoscopy PCCM [facility]    *This note was dictated using voice recognition software/Dragon.  Despite best efforts to proofread, errors can occur which can change the meaning. Any transcriptional errors that result from this process are unintentional and may not be fully corrected at the time of dictation.

## 2023-01-19 ENCOUNTER — Ambulatory Visit: Payer: PPO

## 2023-01-21 ENCOUNTER — Ambulatory Visit: Payer: PPO | Admitting: Pulmonary Disease

## 2023-01-27 ENCOUNTER — Ambulatory Visit: Payer: PPO | Admitting: Pulmonary Disease

## 2023-01-27 ENCOUNTER — Ambulatory Visit
Admission: RE | Admit: 2023-01-27 | Discharge: 2023-01-27 | Disposition: A | Discharge status: Home / Self Care | Payer: PPO | Source: Ambulatory Visit | Attending: Pulmonary Disease | Admitting: Pulmonary Disease

## 2023-01-27 DIAGNOSIS — R911 Solitary pulmonary nodule: Secondary | ICD-10-CM | POA: Insufficient documentation

## 2023-01-27 DIAGNOSIS — Z8673 Personal history of transient ischemic attack (TIA), and cerebral infarction without residual deficits: Secondary | ICD-10-CM | POA: Insufficient documentation

## 2023-01-27 DIAGNOSIS — J439 Emphysema, unspecified: Secondary | ICD-10-CM | POA: Diagnosis not present

## 2023-01-27 DIAGNOSIS — I700 Atherosclerosis of aorta: Secondary | ICD-10-CM | POA: Diagnosis not present

## 2023-01-27 LAB — GLUCOSE, CAPILLARY: Glucose-Capillary: 96 mg/dL (ref 70–99)

## 2023-01-27 MED ORDER — FLUDEOXYGLUCOSE F - 18 (FDG) INJECTION
6.1700 | Freq: Once | INTRAVENOUS | Status: AC | PRN
  Administered 2023-01-27: 6.17 via INTRAVENOUS

## 2023-01-31 ENCOUNTER — Ambulatory Visit: Payer: PPO | Admitting: Pulmonary Disease

## 2023-01-31 ENCOUNTER — Encounter: Payer: Self-pay | Admitting: Pulmonary Disease

## 2023-01-31 ENCOUNTER — Telehealth: Payer: Self-pay

## 2023-01-31 VITALS — BP 146/90 | HR 71 | Temp 96.9°F | Ht 64.0 in | Wt 111.6 lb

## 2023-01-31 DIAGNOSIS — R911 Solitary pulmonary nodule: Secondary | ICD-10-CM

## 2023-01-31 DIAGNOSIS — I619 Nontraumatic intracerebral hemorrhage, unspecified: Secondary | ICD-10-CM

## 2023-01-31 NOTE — H&P (View-Only) (Signed)
Subjective:    Patient ID: Paige Griffin, female    DOB: October 25, 1945, 77 y.o.   MRN: [id number]  Patient Care Team: Luciana Axe, NP as PCP - General [facility])  Chief Complaint  Patient presents with   Follow-up    PET scan results. No SOB, wheezing or cough.     BACKGROUND/INTERVAL:Patient is a 77 year old lifelong former light cigarette smoker who presents for follow-up of a right upper lobe lung nodule noted on CT chest performed 04 January 2023 during admission at [facility] for acute right basal ganglia hemorrhage. This was due to hypertensive urgency. Neurology recommended waiting at least 2 weeks before procedures are contemplated.  I initially saw the patient on 12 January 2023 and a PET/CT was ordered.  She had a PET/CT performed on 27 January 2023 patient presents today for discussion of next steps following this study.   HPI Discussed the use of AI scribe software for clinical note transcription with the patient, who gave verbal consent to proceed.  History of Present Illness   The patient, a 77 year old former smoker, presents for follow-up of a right upper lobe lung nodule. She underwent a PET CT on January 27, 2023, the results of which were not yet available at the time of the consultation. The patient reports feeling fine with no new health issues, specifically denying any stroke-like symptoms.  Upon reviewing the PET CT images, an area of concern was noted in the right lung, which was initially identified on a previous CT scan. The PET CT showed minimal activity in this area, with a slight blush of redness. An additional area of concern was identified in the bowel, prompting a discussion about the patient's last colonoscopy, the date of which the patient could not recall.  The primary concern remains the right upper lobe lung nodule. The patient expressed understanding and willingness to proceed with the biopsy, which would be performed under general  anesthesia. The patient would need to coordinate transportation for the procedure due to her current inability to drive long distances. The patient expressed a desire to have the procedure done as soon as possible.  The patient's lung sounds were assessed during the consultation, lungs are clear.     Review of Systems A 10 point review of systems was performed and it is as noted above otherwise negative.   Patient Active Problem List   Diagnosis Date Noted   UTI (urinary tract infection) 01/06/2023   Hemorrhagic stroke (HCC) 01/04/2023   HTN (hypertension) 01/04/2023   Lung nodule 01/04/2023    Social History   Tobacco Use   Smoking status: Former    Current packs/day: 0.00    Types: Cigarettes    Quit date: 2020    Years since quitting: 5.0   Smokeless tobacco: Never   Tobacco comments:    Smoked 2-3 cigarettes daily  Substance Use Topics   Alcohol use: Never    No Known Allergies  Current Meds  Medication Sig   acetaminophen (TYLENOL) 325 MG tablet Take 2 tablets (650 mg total) by mouth every 4 (four) hours as needed for mild pain (pain score 1-3) (or temp > 37.5 C (99.5 F)).   alendronate (FOSAMAX) 70 MG tablet Take 70 mg by mouth once a week.   Cholecalciferol 50 MCG (2000 UT) CAPS Take by mouth.   cyanocobalamin 1000 MCG tablet Take 2 tablets daily for 2 weeks, then reduce to 1 tablet daily thereafter for Vitamin B12 Deficiency.   losartan (COZAAR)  25 MG tablet Take 25 mg by mouth daily.   pravastatin (PRAVACHOL) 20 MG tablet Take 20 mg by mouth at bedtime.    Immunization History  Administered Date(s) Administered   Influenza,inj,quad, With Preservative 11/08/2016, 11/07/2017   Influenza-Unspecified 11/02/2018, 11/03/2020, 11/09/2021, 11/02/2022   PFIZER Comirnaty(Gray Top)Covid-19 Tri-Sucrose Vaccine 04/04/2019, 04/25/2019, 11/08/2019, 06/16/2020   Pfizer Covid-19 Vaccine Bivalent Booster 72yrs & up 01/05/2021   Pneumococcal Conjugate-13 12/15/2016    Pneumococcal Polysaccharide-23 12/25/2013   Respiratory Syncytial Virus Vaccine,Recomb Aduvanted(Arexvy) 01/13/2022   Tdap 12/25/2013   Unspecified SARS-COV-2 Vaccination 12/16/2021, 11/08/2022   Zoster Recombinant(Shingrix) 07/02/2021, 08/02/2022   Zoster, Live 11/08/2012        Objective:     BP (!) 146/90 (BP Location: Right Arm, Cuff Size: Normal)   Pulse 71   Temp (!) 96.9 F (36.1 C)   Ht 5\' 4"  (1.626 m)   Wt 111 lb 9.6 oz (50.6 kg)   SpO2 95%   BMI 19.16 kg/m   SpO2: 95 % O2 Device: None (Room air)  GENERAL: Well-developed, thin woman, no acute distress, fully ambulatory.  No conversational dyspnea HEAD: Normocephalic, atraumatic.  EYES: Pupils equal, round, reactive to light.  No scleral icterus.  MOUTH: Pharynx clear, no exudate.  Mucous membranes moist. NECK: Supple. No thyromegaly. Trachea midline. No JVD.  No adenopathy. PULMONARY: Good air entry bilaterally.  No adventitious sounds. CARDIOVASCULAR: S1 and S2. Regular rate and rhythm.  ABDOMEN: Benign. MUSCULOSKELETAL: No joint deformity, no clubbing, no edema.  NEUROLOGIC: Mild residual left-sided weakness, per patient improving.  No other deficits noted SKIN: Intact,warm,dry. PSYCH: Mood and behavior normal   Assessment & Plan:     ICD-10-CM   1. Lung nodule  R91.1     2. Hemorrhagic stroke (HCC)  I61.9      Discussion:    Right Upper Lobe Lung Nodule Follow-up for a right upper lobe lung nodule. Recent PET CT on January 27, 2023, showed minimal activity with slight blushing, pending radiologist review. The nodule's etiology remains unclear, necessitating further investigation. Discussed the need for a biopsy to determine the nature of the nodule. Explained that the biopsy will be performed under general anesthesia using robotic-assisted navigational bronchoscopy. Informed about the risks, including a small chance of pneumothorax, and the outpatient nature of the procedure, with one hour under  anesthesia. - Proceed with robotic-assisted navigational bronchoscopy on February 14, 2023, for biopsy of the right upper lobe lesion - [facility] protocol CT chest prior to procedure - Follow up in 3-4 weeks post-procedure  Bowel Lesion Incidental finding of a bowel lesion on the recent PET CT. The patient has had a prior colonoscopy but cannot recall the date. Will inform primary care physician to review records and consider further evaluation. - Inform primary care physician to review records and consider further evaluation of the bowel lesion once formal PET reading available  General Health Maintenance Discussion of the need for a screening colonoscopy given the incidental bowel lesion and the patient's history of prior colonoscopy. - Ensure primary care physician is aware of the need for potential follow-up on bowel lesion.       Advised if symptoms do not improve or worsen, to please contact office for sooner follow up or seek emergency care.    I spent 35 minutes of dedicated to the care of this patient on the date of this encounter to include pre-visit review of records, face-to-face time with the patient discussing conditions above, post visit ordering of testing, clinical documentation  with the electronic health record, making appropriate referrals as documented, and communicating necessary findings to members of the patients care team.     C. Danice Goltz, MD Advanced Bronchoscopy PCCM [facility]    *This note was generated using voice recognition software/Dragon and/or AI transcription program.  Despite best efforts to proofread, errors can occur which can change the meaning. Any transcriptional errors that result from this process are unintentional and may not be fully corrected at the time of dictation.

## 2023-01-31 NOTE — Telephone Encounter (Signed)
Robotic Bronchoscopy 02/14/2023 at 12:30pm Lung Nodule [id number]  Synetta Fail please see Bronch info.

## 2023-01-31 NOTE — Progress Notes (Deleted)
Subjective:    Patient ID: Paige Griffin, female    DOB: 09/29/1945, 77 y.o.   MRN: [id number]  Patient Care Team: Luciana Axe, NP as PCP - General (Family Medicine)  No chief complaint on file.   BACKGROUND:   HPI    Review of Systems A 10 point review of systems was performed and it is as noted above otherwise negative.   Past Medical History:  Diagnosis Date   Cancer (HCC)    skin ca    Past Surgical History:  Procedure Laterality Date   ABDOMINAL HYSTERECTOMY     BREAST BIOPSY Right 10/12/2007   neg   BREAST EXCISIONAL BIOPSY Right     Patient Active Problem List   Diagnosis Date Noted   UTI (urinary tract infection) 01/06/2023   Hemorrhagic stroke (HCC) 01/04/2023   HTN (hypertension) 01/04/2023   Lung nodule 01/04/2023    Family History  Problem Relation Age of Onset   Breast cancer Other 66    Social History   Tobacco Use   Smoking status: Former    Current packs/day: 0.00    Types: Cigarettes    Quit date: 2020    Years since quitting: 5.0   Smokeless tobacco: Never   Tobacco comments:    Smoked 2-3 cigarettes daily  Substance Use Topics   Alcohol use: Never    No Known Allergies  No outpatient medications have been marked as taking for the 01/31/23 encounter (Appointment) with Salena Saner, MD.    Immunization History  Administered Date(s) Administered   Influenza,inj,quad, With Preservative 11/08/2016, 11/07/2017   Influenza-Unspecified 11/02/2018, 11/03/2020, 11/09/2021, 11/02/2022   PFIZER Comirnaty(Gray Top)Covid-19 Tri-Sucrose Vaccine 04/04/2019, 04/25/2019, 11/08/2019, 06/16/2020   Pfizer Covid-19 Vaccine Bivalent Booster 82yrs & up 01/05/2021   Pneumococcal Conjugate-13 12/15/2016   Pneumococcal Polysaccharide-23 12/25/2013   Respiratory Syncytial Virus Vaccine,Recomb Aduvanted(Arexvy) 01/13/2022   Tdap 12/25/2013   Unspecified SARS-COV-2 Vaccination 12/16/2021, 11/08/2022   Zoster Recombinant(Shingrix)  07/02/2021, 08/02/2022   Zoster, Live 11/08/2012        Objective:     There were no vitals taken for this visit.     GENERAL: HEAD: Normocephalic, atraumatic.  EYES: Pupils equal, round, reactive to light.  No scleral icterus.  MOUTH:  NECK: Supple. No thyromegaly. Trachea midline. No JVD.  No adenopathy. PULMONARY: Good air entry bilaterally.  No adventitious sounds. CARDIOVASCULAR: S1 and S2. Regular rate and rhythm.  ABDOMEN: MUSCULOSKELETAL: No joint deformity, no clubbing, no edema.  NEUROLOGIC:  SKIN: Intact,warm,dry. PSYCH:        Assessment & Plan:   No diagnosis found.  No orders of the defined types were placed in this encounter.   No orders of the defined types were placed in this encounter.    Advised if symptoms do not improve or worsen, to please contact office for sooner follow up or seek emergency care.    I spent xxx minutes of dedicated to the care of this patient on the date of this encounter to include pre-visit review of records, face-to-face time with the patient discussing conditions above, post visit ordering of testing, clinical documentation with the electronic health record, making appropriate referrals as documented, and communicating necessary findings to members of the patients care team.   C. Danice Goltz, MD Advanced Bronchoscopy PCCM [facility]    *This note was dictated using voice recognition software/Dragon.  Despite best efforts to proofread, errors can occur which can change the meaning. Any transcriptional errors that result from  this process are unintentional and may not be fully corrected at the time of dictation.

## 2023-01-31 NOTE — Patient Instructions (Signed)
VISIT SUMMARY:  During your visit, we discussed the follow-up for the right upper lobe lung nodule identified in your recent PET CT scan. We also noted an incidental finding in your bowel that requires further evaluation. You reported no new health issues and are feeling fine overall.  YOUR PLAN:  -RIGHT UPPER LOBE LUNG NODULE: A nodule in the right upper part of your lung was found to have minimal activity on the PET CT scan, but its cause is still unclear. We need to perform a biopsy to determine what it is. This will be done using robotic-assisted navigational bronchoscopy under general anesthesia. The procedure will take about four to five hours, with one hour under anesthesia. There is a small risk of a collapsed lung (pneumothorax).  -BOWEL LESION: An unexpected area of concern was found in your bowel during the PET CT scan. Since you cannot recall your last colonoscopy, we will inform your primary care physician to review your records and consider further evaluation.  -GENERAL HEALTH MAINTENANCE: We discussed the importance of a screening colonoscopy due to the incidental bowel lesion and your history of a prior colonoscopy. Your primary care physician will be informed to ensure appropriate follow-up.  INSTRUCTIONS:  Please proceed with the robotic-assisted navigational bronchoscopy on February 14, 2023, for the biopsy of the right upper lobe lung nodule. Follow up with [place] in 3-4 weeks after the procedure. Additionally, your primary care physician will review your records and consider further evaluation of the bowel lesion.

## 2023-01-31 NOTE — Telephone Encounter (Signed)
Pre Admit- 02/08/2022 between 8a-1p.  I tried to contact the patient. She was not home. I will try again later.

## 2023-02-01 ENCOUNTER — Encounter: Payer: Self-pay | Admitting: Pulmonary Disease

## 2023-02-04 NOTE — Telephone Encounter (Signed)
 I have notified the patient of her Pre-Admit appt.

## 2023-02-07 NOTE — Telephone Encounter (Signed)
 Noted. Nothing further needed.

## 2023-02-07 NOTE — Telephone Encounter (Signed)
 For the code [id number] Auth # [id number] valid 02/03/2023 to 05/04/2023

## 2023-02-09 ENCOUNTER — Encounter
Admission: RE | Admit: 2023-02-09 | Discharge: 2023-02-09 | Disposition: A | Discharge status: Home / Self Care | Payer: PPO | Source: Ambulatory Visit | Attending: Pulmonary Disease | Admitting: Pulmonary Disease

## 2023-02-09 ENCOUNTER — Other Ambulatory Visit: Payer: Self-pay

## 2023-02-09 HISTORY — DX: Urinary tract infection, site not specified: N39.0

## 2023-02-09 HISTORY — DX: Essential (primary) hypertension: I10

## 2023-02-09 HISTORY — DX: Solitary pulmonary nodule: R91.1

## 2023-02-09 HISTORY — DX: Nontraumatic intracerebral hemorrhage, unspecified: I61.9

## 2023-02-09 NOTE — Patient Instructions (Addendum)
 Your procedure is scheduled on:  Monday January 13  Report to the Registration Desk on the 1st floor of the [facility] To find out your arrival time, please call [phone] between 1PM - 3PM on:  Friday January 10  If your arrival time is 6:00 am, do not arrive before that time as the Medical Mall entrance doors do not open until 6:00 am.  REMEMBER: Instructions that are not followed completely may result in serious medical risk, up to and including death; or upon the discretion of your surgeon and anesthesiologist your surgery may need to be rescheduled.  Do not eat food after midnight the night before surgery.  No gum chewing or hard candies.   One week prior to surgery: Monday January 6  Stop Anti-inflammatories (NSAIDS) such as Advil, Aleve, Ibuprofen, Motrin, Naproxen, Naprosyn and Aspirin based products such as Excedrin, Goody's Powder, BC Powder. Stop ANY OVER THE COUNTER supplements until after surgery.  You may however, continue to take Tylenol  if needed for pain up until the day of surgery.  Continue taking all of your other prescription medications up until the day of surgery.  ON THE DAY OF SURGERY ONLY TAKE THESE MEDICATIONS WITH SIPS OF WATER:  Cholecalciferol  Cyanocobalamin  alendronate (FOSAMAX)    No Alcohol for 24 hours before or after surgery.  No Smoking including e-cigarettes for 24 hours before surgery.  No chewable tobacco products for at least 6 hours before surgery.  No nicotine patches on the day of surgery.  Do not use any recreational drugs for at least a week (preferably 2 weeks) before your surgery.  Please be advised that the combination of cocaine and anesthesia may have negative outcomes, up to and including death. If you test positive for cocaine, your surgery will be cancelled.  On the morning of surgery brush your teeth with toothpaste and water, you may rinse your mouth with mouthwash if you wish. Do not swallow any toothpaste or  mouthwash.   Do not wear jewelry, make-up, hairpins, clips or nail polish.  For welded (permanent) jewelry: bracelets, anklets, waist bands, etc.  Please have this removed prior to surgery.  If it is not removed, there is a chance that hospital personnel will need to cut it off on the day of surgery.  Do not wear lotions, powders, or perfumes.   Do not shave body hair from the neck down 48 hours before surgery.  Contact lenses, hearing aids and dentures may not be worn into surgery.  Do not bring valuables to the hospital. [facility] is not responsible for any missing/lost belongings or valuables.   Notify your doctor if there is any change in your medical condition (cold, fever, infection).  Wear comfortable clothing (specific to your surgery type) to the hospital.  After surgery, you can help prevent lung complications by doing breathing exercises.  Take deep breaths and cough every 1-2 hours. Your doctor may order a device called an Incentive Spirometer to help you take deep breaths.  If you are being discharged the day of surgery, you will not be allowed to drive home. You will need a responsible individual to drive you home and stay with you for 24 hours after surgery.   If you are taking public transportation, you will need to have a responsible individual with you.  Please call the Pre-admissions Testing Dept. at [phone] if you have any questions about these instructions.  Surgery Visitation Policy:  Patients having surgery or a procedure  may have two visitors.  Children under the age of 42 must have an adult with them who is not the patient.

## 2023-02-11 ENCOUNTER — Ambulatory Visit: Admission: RE | Admit: 2023-02-11 | Payer: PPO | Source: Ambulatory Visit

## 2023-02-11 ENCOUNTER — Ambulatory Visit: Payer: PPO

## 2023-02-13 MED ORDER — METOCLOPRAMIDE HCL 5 MG/ML IJ SOLN
INTRAMUSCULAR | Status: AC
  Filled 2023-02-13: qty 2

## 2023-02-14 DIAGNOSIS — R911 Solitary pulmonary nodule: Secondary | ICD-10-CM

## 2023-02-25 ENCOUNTER — Encounter
Admission: RE | Admit: 2023-02-25 | Discharge: 2023-02-25 | Disposition: A | Discharge status: Home / Self Care | Payer: PPO | Source: Ambulatory Visit | Attending: Pulmonary Disease

## 2023-02-25 ENCOUNTER — Ambulatory Visit
Admission: RE | Admit: 2023-02-25 | Discharge: 2023-02-25 | Disposition: A | Discharge status: Home / Self Care | Payer: PPO | Source: Ambulatory Visit | Attending: Pulmonary Disease | Admitting: Pulmonary Disease

## 2023-02-25 DIAGNOSIS — R911 Solitary pulmonary nodule: Secondary | ICD-10-CM | POA: Diagnosis present

## 2023-02-25 HISTORY — DX: Solitary pulmonary nodule: R91.1

## 2023-02-25 HISTORY — DX: Cerebral aneurysm, nonruptured: I67.1

## 2023-02-25 HISTORY — DX: Hyperlipidemia, unspecified: E78.5

## 2023-02-25 HISTORY — DX: Vitamin D deficiency, unspecified: E55.9

## 2023-02-25 HISTORY — DX: Deficiency of other specified B group vitamins: E53.8

## 2023-02-25 NOTE — Progress Notes (Signed)
Called pt for chart review for upcoming 02-28-23 surgery with Dr Jayme Cloud. Pt states surgery was cancelled related to daughter who got sick who was to take care of pts husband while she was at the hospital for her surgery. Pt had to wait until daughter was better so she could reschedule her surgery. Reviewed surgery instructions with pt. NPO after MN Sunday night (Noo food or liquids) Do NOT take any medication the day of surgery. Stop vitamins now and no NSAIDS after today. Pt verbalized understanding. Pt was given SDS number to call this afternoon bet 1-3 pm to find out arrival time and location of where to arrive for her surgery

## 2023-02-25 NOTE — Patient Instructions (Signed)
Your procedure is scheduled on:02-28-23 Monday Report to the Registration Desk on the 1st floor of the [facility].Then proceed to the 2nd floor Surgery Desk To find out your arrival time, please call [phone] between 1PM - 3PM on:02-25-23 Friday If your arrival time is 6:00 am, do not arrive before that time as the Medical Mall entrance doors do not open until 6:00 am.  REMEMBER: Instructions that are not followed completely may result in serious medical risk, up to and including death; or upon the discretion of your surgeon and anesthesiologist your surgery may need to be rescheduled.  Do not eat food OR drink any liquids after midnight the night before surgery.  No gum chewing or hard candies.  One week prior to surgery:Stop NOW (02-25-23) Stop Anti-inflammatories (NSAIDS) such as Advil, Aleve, Ibuprofen, Motrin, Naproxen, Naprosyn and Aspirin based products such as Excedrin, Goody's Powder, BC Powder. Stop ANY OVER THE COUNTER supplements until after surgery (Vitamin D, B12)  You may however, continue to take Tylenol if needed for pain up until the day of surgery.  Continue taking all of your other prescription medications up until the day of surgery.  Do NOT take any medication the day of surgery  No Alcohol for 24 hours before or after surgery.  No Smoking including e-cigarettes for 24 hours before surgery.  No chewable tobacco products for at least 6 hours before surgery.  No nicotine patches on the day of surgery.  Do not use any "recreational" drugs for at least a week (preferably 2 weeks) before your surgery.  Please be advised that the combination of cocaine and anesthesia may have negative outcomes, up to and including death. If you test positive for cocaine, your surgery will be cancelled.  On the morning of surgery brush your teeth with toothpaste and water, you may rinse your mouth with mouthwash if you wish. Do not swallow any toothpaste or mouthwash. Do not wear  jewelry, make-up, hairpins, clips or nail polish.  For welded (permanent) jewelry: bracelets, anklets, waist bands, etc.  Please have this removed prior to surgery.  If it is not removed, there is a chance that hospital personnel will need to cut it off on the day of surgery.  Do not wear lotions, powders, or perfumes.   Do not shave body hair from the neck down 48 hours before surgery.  Contact lenses, hearing aids and dentures may not be worn into surgery.  Do not bring valuables to the hospital. [facility] is not responsible for any missing/lost belongings or valuables.   Notify your doctor if there is any change in your medical condition (cold, fever, infection).  Wear comfortable clothing (specific to your surgery type) to the hospital.  After surgery, you can help prevent lung complications by doing breathing exercises.  Take deep breaths and cough every 1-2 hours. Your doctor may order a device called an Incentive Spirometer to help you take deep breaths. When coughing or sneezing, hold a pillow firmly against your incision with both hands. This is called "splinting." Doing this helps protect your incision. It also decreases belly discomfort.  If you are being admitted to the hospital overnight, leave your suitcase in the car. After surgery it may be brought to your room.  In case of increased patient census, it may be necessary for you, the patient, to continue your postoperative care in the Same Day Surgery department.  If you are being discharged the day of surgery, you will not be allowed to  drive home. You will need a responsible individual to drive you home and stay with you for 24 hours after surgery.   If you are taking public transportation, you will need to have a responsible individual with you.  Please call the Pre-admissions Testing Dept. at [phone] if you have any questions about these instructions.  Surgery Visitation Policy:  Patients having surgery or  a procedure may have two visitors.  Children under the age of 65 must have an adult with them who is not the patient.  Temporary Visitor Restrictions Due to increasing cases of flu, RSV and COVID-19: Children ages 74 and under will not be able to visit patients in [facility] hospitals under most circumstances.

## 2023-02-27 MED ORDER — ORAL CARE MOUTH RINSE: 15.0000 mL | Freq: Once | OROMUCOSAL | Status: AC

## 2023-02-27 MED ORDER — IPRATROPIUM-ALBUTEROL 0.5-2.5 (3) MG/3ML IN SOLN
3.0000 mL | Freq: Once | RESPIRATORY_TRACT | Status: AC
  Administered 2023-02-28: 3 mL via RESPIRATORY_TRACT

## 2023-02-27 MED ORDER — SODIUM CHLORIDE 0.9 % IV SOLN: Freq: Once | INTRAVENOUS | Status: DC

## 2023-02-27 MED ORDER — CHLORHEXIDINE GLUCONATE 0.12 % MT SOLN
15.0000 mL | Freq: Once | OROMUCOSAL | Status: AC
  Administered 2023-02-28: 15 mL via OROMUCOSAL

## 2023-02-28 ENCOUNTER — Encounter: Payer: Self-pay | Admitting: Pulmonary Disease

## 2023-02-28 ENCOUNTER — Other Ambulatory Visit: Payer: Self-pay | Admitting: Pulmonary Disease

## 2023-02-28 ENCOUNTER — Encounter: Payer: Self-pay | Admitting: Urgent Care

## 2023-02-28 ENCOUNTER — Encounter
Admission: RE | Disposition: A | Discharge status: Home / Self Care | Payer: Self-pay | Source: Home / Self Care | Attending: Pulmonary Disease

## 2023-02-28 ENCOUNTER — Other Ambulatory Visit: Payer: Self-pay

## 2023-02-28 ENCOUNTER — Encounter: Payer: Self-pay | Admitting: Certified Registered Nurse Anesthetist

## 2023-02-28 ENCOUNTER — Ambulatory Visit
Admission: RE | Admit: 2023-02-28 | Discharge: 2023-02-28 | Disposition: A | Discharge status: Home / Self Care | Payer: PPO | Attending: Pulmonary Disease | Admitting: Pulmonary Disease

## 2023-02-28 DIAGNOSIS — R911 Solitary pulmonary nodule: Secondary | ICD-10-CM | POA: Insufficient documentation

## 2023-02-28 DIAGNOSIS — Z5329 Procedure and treatment not carried out because of patient's decision for other reasons: Secondary | ICD-10-CM | POA: Diagnosis not present

## 2023-02-28 DIAGNOSIS — Z87891 Personal history of nicotine dependence: Secondary | ICD-10-CM | POA: Insufficient documentation

## 2023-02-28 DIAGNOSIS — Z8673 Personal history of transient ischemic attack (TIA), and cerebral infarction without residual deficits: Secondary | ICD-10-CM | POA: Insufficient documentation

## 2023-02-28 SURGERY — ROBOTIC ASSISTED NAVIGATIONAL BRONCHOSCOPY
Anesthesia: General | Laterality: Right

## 2023-02-28 MED ORDER — CHLORHEXIDINE GLUCONATE 0.12 % MT SOLN
OROMUCOSAL | Status: AC
  Filled 2023-02-28: qty 15

## 2023-02-28 MED ORDER — IPRATROPIUM-ALBUTEROL 0.5-2.5 (3) MG/3ML IN SOLN
RESPIRATORY_TRACT | Status: AC
  Filled 2023-02-28: qty 3

## 2023-02-28 MED ORDER — FENTANYL CITRATE (PF) 100 MCG/2ML IJ SOLN
INTRAMUSCULAR | Status: AC
  Filled 2023-02-28: qty 2

## 2023-02-28 NOTE — Progress Notes (Addendum)
Patient had reservations with regards to general anesthesia in the setting of hemorrhagic stroke which occurred 04 January 2023.  The patient during that admission at [facility] for her stroke, was noted to have a right upper lobe nodule/mass.  This needs biopsy for definitive diagnosis.  Because of this circumstance, prior to procedures I requested that neurology evaluate the patient for feasibility of invasive procedures in the setting of acute stroke.  [facility] neuro[facility]ist saw the patient on 5 December recommended that bronchoscopy be performed in approximately 2 weeks time as this would be a reasonable balance between the risk of further hemorrhage and the risk of delaying cancer diagnosis and treatment.  These recommendations have been discussed with the patient on her prior visits with me at the clinic on 12 January 2023 and 31 January 2023.  The patient also was evaluated by the preadmission clinic and no concerns were raised at the time.  The patient was evaluated by anesthesia today (Dr. Lorette Ang) and after speaking with Dr. Lorette Ang, the patient has reconsidered undergoing general anesthesia today.  She is very concerned about a recurring stroke.  I advised the patient that I cannot guarantee that the mass in her upper lobe is not cancer and that waiting may increase the risk of potential metastatic disease.  At this point the patient would like to hold off on diagnostic procedures.  She would like to wait at least 6 months from her initial event on 3 December.  In the interim will obtain CT chest in 8-12 weeks and follow-up with the patient after CT is performed.  Procedure for today has been canceled at the patient's request.  C. Danice Goltz, MD Advanced Bronchoscopy PCCM [facility]    *This note was generated using voice recognition software/Dragon and/or AI transcription program.  Despite best efforts to proofread, errors can occur which can change the meaning. Any  transcriptional errors that result from this process are unintentional and may not be fully corrected at the time of dictation.

## 2023-02-28 NOTE — Interval H&P Note (Signed)
Paige Griffin has presented today for surgery, with the diagnosis of RIGHT UPPER LOBE LUNG NODULE.  The various methods of treatment have been discussed with the patient and family. After consideration of risks, benefits and other options for treatment, the patient has consented to  Procedure(s): ROBOTIC ASSISTED NAVIGATIONAL BRONCHOSCOPY-RIGHT as a surgical intervention.  The patient's history has been reviewed, patient examined, no change in status, stable for surgery.  I have reviewed the patient's chart and labs.  Questions were answered to the patient's satisfaction.  Benefits, limitations and potential complications of the procedure were discussed with the patient/family.  Complications from bronchoscopy are rare and most often minor, but if they occur they may include breathing difficulty, vocal cord spasm, hoarseness, slight fever, vomiting, dizziness, bronchospasm, infection, low blood oxygen, bleeding from biopsy site, or an allergic reaction to medications.  It is uncommon for patients to experience other more serious complications for example: Collapsed lung requiring chest tube placement, respiratory failure, heart attack and/or cardiac arrhythmia.  Patient has agreed to proceed.  Gailen Shelter, MD Advanced Bronchoscopy PCCM [facility]    *This note was generated using voice recognition software/Dragon and/or AI transcription program.  Despite best efforts to proofread, errors can occur which can change the meaning. Any transcriptional errors that result from this process are unintentional and may not be fully corrected at the time of dictation.

## 2023-02-28 NOTE — Progress Notes (Signed)
Patient declined to have bronchoscopy today.  Will continue to follow lung lesion with CT chest.  Will schedule CT chest without contrast in 2 to 3 months with follow-up in the clinic after that.  Gailen Shelter, MD Advanced Bronchoscopy PCCM [facility]

## 2023-02-28 NOTE — Progress Notes (Signed)
Patient spoke with Dr. Jayme Cloud after talking with anesthesiologist Dr. Lorette Ang and decided to not have the procedure today. IV catheter removed, patient tolerated well. Patient ok to wait at this time.

## 2023-05-10 ENCOUNTER — Ambulatory Visit
Admission: RE | Admit: 2023-05-10 | Discharge: 2023-05-10 | Disposition: A | Discharge status: Home / Self Care | Source: Ambulatory Visit | Attending: Pulmonary Disease | Admitting: Pulmonary Disease

## 2023-05-10 DIAGNOSIS — R911 Solitary pulmonary nodule: Secondary | ICD-10-CM | POA: Diagnosis present

## 2023-06-13 ENCOUNTER — Other Ambulatory Visit: Payer: Self-pay | Admitting: Gerontology

## 2023-06-13 DIAGNOSIS — Z1231 Encounter for screening mammogram for malignant neoplasm of breast: Secondary | ICD-10-CM

## 2023-06-14 ENCOUNTER — Telehealth: Payer: Self-pay

## 2023-06-14 ENCOUNTER — Encounter: Payer: Self-pay | Admitting: Pulmonary Disease

## 2023-06-14 ENCOUNTER — Ambulatory Visit: Admitting: Pulmonary Disease

## 2023-06-14 VITALS — BP 136/88 | HR 90 | Temp 98.5°F | Ht 64.0 in | Wt 113.8 lb

## 2023-06-14 DIAGNOSIS — Z87891 Personal history of nicotine dependence: Secondary | ICD-10-CM | POA: Diagnosis not present

## 2023-06-14 DIAGNOSIS — I10 Essential (primary) hypertension: Secondary | ICD-10-CM | POA: Diagnosis not present

## 2023-06-14 DIAGNOSIS — Z8673 Personal history of transient ischemic attack (TIA), and cerebral infarction without residual deficits: Secondary | ICD-10-CM | POA: Diagnosis not present

## 2023-06-14 DIAGNOSIS — R911 Solitary pulmonary nodule: Secondary | ICD-10-CM

## 2023-06-14 DIAGNOSIS — I619 Nontraumatic intracerebral hemorrhage, unspecified: Secondary | ICD-10-CM

## 2023-06-14 NOTE — Telephone Encounter (Signed)
 Robotic Bronch 07/04/2023 at 8:00am Lung Nodule [id number]

## 2023-06-14 NOTE — Patient Instructions (Signed)
 History of Present Illness Paige Griffin is a 78 year old female who presents for follow-up of a lung nodule.  She follows up for a lung nodule located on the right apex. A CT scan performed in April showed that the nodule has not significantly changed in size, with only a minor area showing a 1 mm increase. No cough or other respiratory symptoms are present at this time.  She has a history of stroke but has recovered well and is not currently taking any medication for it.  She has upcoming appointments for a mammogram and a bone density test scheduled for Thursday at the [facility].  Assessment and Plan Lung nodule Lung nodule at the right apex with a slight increase of one millimeter in one area. The nodule's behavior is uncertain, necessitating further evaluation to determine if it is benign or malignant. Biopsy is preferred over follow-up CT scan to avoid unnecessary radiation and provide a definitive diagnosis. If benign, no further follow-up CTs are needed; if malignant, further intervention is required. She consented to proceed with biopsy. - Schedule robotic-assisted bronchoscopy for biopsy on June 2nd at 8 AM. - Order pre-procedural scan (CT) to map the area for biopsy.  Stroke No current symptoms or treatment related to previous stroke. She reports doing well without any medications for stroke management.   We discussed that the procedure would have to be done under general anesthesia. The anesthesia team will discuss his part of the process with you.  Complications from the procedure itself are usually minor. One potential complication would be collapse of the lung which can occur in 2-3% of the cases. If  this happens we would have to put a small tube to relieve the collapse and you would have to spend the night in the hospital in that event.  Another possibility would be that of bleeding, this is usually taken care of during the procedure.  In the situations you may need  to be observed overnight.  For the most part though, should be able to go home the same day.  Other possibilities could include that the procedure would be nondiagnostic meaning that no definitive diagnosis could be attained.  We strive to try to decrease these potential issues.  We will see you in follow-up around mid June after your procedure.

## 2023-06-14 NOTE — Telephone Encounter (Signed)
 Robotic bronch scheduled 07/04/2023 at 8:00. Dx: lung nodule.   Almira Armour, please see.

## 2023-06-14 NOTE — H&P (View-Only) (Signed)
 Subjective:    Patient ID: Paige Griffin, female    DOB: Aug 07, 1945, 78 y.o.   MRN: [id number]  Patient Care Team: Efraim Grange, NP as PCP - General (Family Medicine)  Chief Complaint  Patient presents with   Follow-up    CT 5/4-no current sx.     BACKGROUND/INTERVAL:Patient is a 78 year old lifelong former light cigarette smoker who presents for follow-up of a right upper lobe lung nodule noted on CT chest performed 04 January 2023 during admission at [facility] for acute right basal ganglia hemorrhage. This was due to hypertensive urgency. Neurology recommended waiting at least 2 weeks before procedures are contemplated.  I initially saw the patient on 12 January 2023 and a PET/CT was ordered.  She had a PET/CT performed on 27 January 2023, this showed low-level of FDG uptake on the right upper lobe pulmonary nodule cannot determine whether this may be infectious versus malignant.  Bronchoscopy recommended.  Patient scheduled for robotic assisted bronchoscopy on 25 February 2023 however anesthesia felt uneasy about inducing the patient due to the history of recent basal ganglia hemorrhage.  Recall patient had been cleared by neurology.  Patient returns today after repeat CT performed 10 May 2023.  Nodule remains fairly stable in size.  HPI Discussed the use of AI scribe software for clinical note transcription with the patient, who gave verbal consent to proceed.  History of Present Illness   Paige Griffin is a 78 year old female who presents for follow-up of a lung nodule.  She follows up for a lung nodule located on the right apex. A CT scan performed in April showed that the nodule has not significantly changed in size, with only a minor area showing a 1 mm increase. No cough or other respiratory symptoms are present at this time.  She has a history of stroke but has recovered well and is not currently taking any medication for it.  Recall, neurology had given the go ahead for  invasive procedures.  We discussed to proceed with robotic assisted navigational bronchoscopy.  She is now anxious to complete this procedure.  She does not endorse any weight loss or anorexia.  No respiratory symptoms to include cough or sputum production, no hemoptysis.  No fevers, chills or sweats.    Review of Systems A 10 point review of systems was performed and it is as noted above otherwise negative.   Patient Active Problem List   Diagnosis Date Noted   UTI (urinary tract infection) 01/06/2023   Hemorrhagic stroke (HCC) 01/04/2023   HTN (hypertension) 01/04/2023   Lung nodule 01/04/2023    Social History   Tobacco Use   Smoking status: Former    Current packs/day: 0.00    Types: Cigarettes    Quit date: 2020    Years since quitting: 5.4   Smokeless tobacco: Never   Tobacco comments:    Smoked 2-3 cigarettes daily  Substance Use Topics   Alcohol use: Never    No Known Allergies  Current Meds  Medication Sig   acetaminophen  (TYLENOL ) 325 MG tablet Take 2 tablets (650 mg total) by mouth every 4 (four) hours as needed for mild pain (pain score 1-3) (or temp > 37.5 C (99.5 F)).   alendronate (FOSAMAX) 70 MG tablet Take 70 mg by mouth once a week.   Cholecalciferol 50 MCG (2000 UT) CAPS Take by mouth.   cyanocobalamin 1000 MCG tablet Take 2 tablets daily for 2 weeks, then reduce to 1 tablet  daily thereafter for Vitamin B12 Deficiency.   losartan  (COZAAR ) 25 MG tablet Take 25 mg by mouth at bedtime.   pravastatin  (PRAVACHOL ) 20 MG tablet Take 20 mg by mouth at bedtime.    Immunization History  Administered Date(s) Administered   Influenza,inj,quad, With Preservative 11/08/2016, 11/07/2017   Influenza-Unspecified 11/02/2018, 11/03/2020, 11/09/2021, 11/02/2022   PFIZER Comirnaty(Gray Top)Covid-19 Tri-Sucrose Vaccine 04/04/2019, 04/25/2019, 11/08/2019, 06/16/2020   Pfizer Covid-19 Vaccine Bivalent Booster 79yrs & up 01/05/2021   Pneumococcal Conjugate-13 12/15/2016    Pneumococcal Polysaccharide-23 12/25/2013   Respiratory Syncytial Virus Vaccine,Recomb Aduvanted(Arexvy) 01/13/2022   Tdap 12/25/2013   Unspecified SARS-COV-2 Vaccination 12/16/2021, 11/08/2022   Zoster Recombinant(Shingrix) 07/02/2021, 08/02/2022   Zoster, Live 11/08/2012        Objective:     BP 136/88 (BP Location: Right Arm, Patient Position: Sitting, Cuff Size: Normal)   Pulse 90   Temp 98.5 F (36.9 C) (Oral)   Ht 5\' 4"  (1.626 m)   Wt 113 lb 12.8 oz (51.6 kg)   SpO2 97%   BMI 19.53 kg/m   SpO2: 97 %  GENERAL: Well-developed, thin woman, no acute distress, fully ambulatory.  No conversational dyspnea HEAD: Normocephalic, atraumatic.  EYES: Pupils equal, round, reactive to light.  No scleral icterus.  MOUTH: Pharynx clear, no exudate.  Mucous membranes moist. NECK: Supple. No thyromegaly. Trachea midline. No JVD.  No adenopathy. PULMONARY: Good air entry bilaterally.  No adventitious sounds. CARDIOVASCULAR: S1 and S2. Regular rate and rhythm.  ABDOMEN: Benign. MUSCULOSKELETAL: No joint deformity, no clubbing, no edema.  NEUROLOGIC: Mild residual left-sided weakness, per patient improving.  No other deficits noted SKIN: Intact,warm,dry. PSYCH: Mood and behavior normal   Representative image from CT performed 10 May 2023 showing the right upper lobe nodule in question (arrow):    Assessment & Plan:     ICD-10-CM   1. Lung nodule  R91.1 CT SUPER D CHEST WO MONARCH PILOT    2. Hemorrhagic stroke (HCC) - no sequela, no sx.  I61.9     3. Hypertension, unspecified type  I10       Orders Placed This Encounter  Procedures   CT SUPER D CHEST WO MONARCH PILOT    Standing Status:   Future    Number of Occurrences:   1    Expiration Date:   06/13/2024    Scheduling Instructions:     Please do before 04 July 2023.  Patient has robotic assisted navigational bronchoscopy on 2 June.    Preferred imaging location?:   [facility]   Discussion:    Lung  nodule Lung nodule at the right apex with a slight increase of one millimeter in one area. The nodule's behavior is uncertain, necessitating further evaluation to determine if it is benign or malignant. Biopsy is preferred over follow-up CT scan to avoid unnecessary radiation and provide a definitive diagnosis. If benign, no further follow-up CTs are needed; if malignant, further intervention is required. She consented to proceed with biopsy. - Schedule robotic-assisted bronchoscopy for biopsy on June 2nd at 8 AM. - Order pre-procedural scan to map the area for biopsy.  Stroke No current symptoms or treatment related to previous stroke. She reports doing well without any medications for stroke management.  Deviously cleared by neurology     Advised if symptoms do not improve or worsen, to please contact office for sooner follow up or seek emergency care.    I spent 32 minutes of dedicated to the care of this patient on the  date of this encounter to include pre-visit review of records, face-to-face time with the patient discussing conditions above, post visit ordering of testing, clinical documentation with the electronic health record, making appropriate referrals as documented, and communicating necessary findings to members of the patients care team.     C. Chloe Counter, MD Advanced Bronchoscopy P[facility]    *This note was generated using voice recognition software/Dragon and/or AI transcription program.  Despite best efforts to proofread, errors can occur which can change the meaning. Any transcriptional errors that result from this process are unintentional and may not be fully corrected at the time of dictation.

## 2023-06-14 NOTE — Progress Notes (Signed)
 Subjective:    Patient ID: Paige Griffin, female    DOB: Aug 07, 1945, 78 y.o.   MRN: [id number]  Patient Care Team: Efraim Grange, NP as PCP - General (Family Medicine)  Chief Complaint  Patient presents with   Follow-up    CT 5/4-no current sx.     BACKGROUND/INTERVAL:Patient is a 78 year old lifelong former light cigarette smoker who presents for follow-up of a right upper lobe lung nodule noted on CT chest performed 04 January 2023 during admission at [facility] for acute right basal ganglia hemorrhage. This was due to hypertensive urgency. Neurology recommended waiting at least 2 weeks before procedures are contemplated.  I initially saw the patient on 12 January 2023 and a PET/CT was ordered.  She had a PET/CT performed on 27 January 2023, this showed low-level of FDG uptake on the right upper lobe pulmonary nodule cannot determine whether this may be infectious versus malignant.  Bronchoscopy recommended.  Patient scheduled for robotic assisted bronchoscopy on 25 February 2023 however anesthesia felt uneasy about inducing the patient due to the history of recent basal ganglia hemorrhage.  Recall patient had been cleared by neurology.  Patient returns today after repeat CT performed 10 May 2023.  Nodule remains fairly stable in size.  HPI Discussed the use of AI scribe software for clinical note transcription with the patient, who gave verbal consent to proceed.  History of Present Illness   Paige Griffin is a 78 year old female who presents for follow-up of a lung nodule.  She follows up for a lung nodule located on the right apex. A CT scan performed in April showed that the nodule has not significantly changed in size, with only a minor area showing a 1 mm increase. No cough or other respiratory symptoms are present at this time.  She has a history of stroke but has recovered well and is not currently taking any medication for it.  Recall, neurology had given the go ahead for  invasive procedures.  We discussed to proceed with robotic assisted navigational bronchoscopy.  She is now anxious to complete this procedure.  She does not endorse any weight loss or anorexia.  No respiratory symptoms to include cough or sputum production, no hemoptysis.  No fevers, chills or sweats.    Review of Systems A 10 point review of systems was performed and it is as noted above otherwise negative.   Patient Active Problem List   Diagnosis Date Noted   UTI (urinary tract infection) 01/06/2023   Hemorrhagic stroke (HCC) 01/04/2023   HTN (hypertension) 01/04/2023   Lung nodule 01/04/2023    Social History   Tobacco Use   Smoking status: Former    Current packs/day: 0.00    Types: Cigarettes    Quit date: 2020    Years since quitting: 5.4   Smokeless tobacco: Never   Tobacco comments:    Smoked 2-3 cigarettes daily  Substance Use Topics   Alcohol use: Never    No Known Allergies  Current Meds  Medication Sig   acetaminophen  (TYLENOL ) 325 MG tablet Take 2 tablets (650 mg total) by mouth every 4 (four) hours as needed for mild pain (pain score 1-3) (or temp > 37.5 C (99.5 F)).   alendronate (FOSAMAX) 70 MG tablet Take 70 mg by mouth once a week.   Cholecalciferol 50 MCG (2000 UT) CAPS Take by mouth.   cyanocobalamin 1000 MCG tablet Take 2 tablets daily for 2 weeks, then reduce to 1 tablet  daily thereafter for Vitamin B12 Deficiency.   losartan  (COZAAR ) 25 MG tablet Take 25 mg by mouth at bedtime.   pravastatin  (PRAVACHOL ) 20 MG tablet Take 20 mg by mouth at bedtime.    Immunization History  Administered Date(s) Administered   Influenza,inj,quad, With Preservative 11/08/2016, 11/07/2017   Influenza-Unspecified 11/02/2018, 11/03/2020, 11/09/2021, 11/02/2022   PFIZER Comirnaty(Gray Top)Covid-19 Tri-Sucrose Vaccine 04/04/2019, 04/25/2019, 11/08/2019, 06/16/2020   Pfizer Covid-19 Vaccine Bivalent Booster 79yrs & up 01/05/2021   Pneumococcal Conjugate-13 12/15/2016    Pneumococcal Polysaccharide-23 12/25/2013   Respiratory Syncytial Virus Vaccine,Recomb Aduvanted(Arexvy) 01/13/2022   Tdap 12/25/2013   Unspecified SARS-COV-2 Vaccination 12/16/2021, 11/08/2022   Zoster Recombinant(Shingrix) 07/02/2021, 08/02/2022   Zoster, Live 11/08/2012        Objective:     BP 136/88 (BP Location: Right Arm, Patient Position: Sitting, Cuff Size: Normal)   Pulse 90   Temp 98.5 F (36.9 C) (Oral)   Ht 5\' 4"  (1.626 m)   Wt 113 lb 12.8 oz (51.6 kg)   SpO2 97%   BMI 19.53 kg/m   SpO2: 97 %  GENERAL: Well-developed, thin woman, no acute distress, fully ambulatory.  No conversational dyspnea HEAD: Normocephalic, atraumatic.  EYES: Pupils equal, round, reactive to light.  No scleral icterus.  MOUTH: Pharynx clear, no exudate.  Mucous membranes moist. NECK: Supple. No thyromegaly. Trachea midline. No JVD.  No adenopathy. PULMONARY: Good air entry bilaterally.  No adventitious sounds. CARDIOVASCULAR: S1 and S2. Regular rate and rhythm.  ABDOMEN: Benign. MUSCULOSKELETAL: No joint deformity, no clubbing, no edema.  NEUROLOGIC: Mild residual left-sided weakness, per patient improving.  No other deficits noted SKIN: Intact,warm,dry. PSYCH: Mood and behavior normal   Representative image from CT performed 10 May 2023 showing the right upper lobe nodule in question (arrow):    Assessment & Plan:     ICD-10-CM   1. Lung nodule  R91.1 CT SUPER D CHEST WO MONARCH PILOT    2. Hemorrhagic stroke (HCC) - no sequela, no sx.  I61.9     3. Hypertension, unspecified type  I10       Orders Placed This Encounter  Procedures   CT SUPER D CHEST WO MONARCH PILOT    Standing Status:   Future    Number of Occurrences:   1    Expiration Date:   06/13/2024    Scheduling Instructions:     Please do before 04 July 2023.  Patient has robotic assisted navigational bronchoscopy on 2 June.    Preferred imaging location?:   [facility]   Discussion:    Lung  nodule Lung nodule at the right apex with a slight increase of one millimeter in one area. The nodule's behavior is uncertain, necessitating further evaluation to determine if it is benign or malignant. Biopsy is preferred over follow-up CT scan to avoid unnecessary radiation and provide a definitive diagnosis. If benign, no further follow-up CTs are needed; if malignant, further intervention is required. She consented to proceed with biopsy. - Schedule robotic-assisted bronchoscopy for biopsy on June 2nd at 8 AM. - Order pre-procedural scan to map the area for biopsy.  Stroke No current symptoms or treatment related to previous stroke. She reports doing well without any medications for stroke management.  Deviously cleared by neurology     Advised if symptoms do not improve or worsen, to please contact office for sooner follow up or seek emergency care.    I spent 32 minutes of dedicated to the care of this patient on the  date of this encounter to include pre-visit review of records, face-to-face time with the patient discussing conditions above, post visit ordering of testing, clinical documentation with the electronic health record, making appropriate referrals as documented, and communicating necessary findings to members of the patients care team.     C. Chloe Counter, MD Advanced Bronchoscopy P[facility]    *This note was generated using voice recognition software/Dragon and/or AI transcription program.  Despite best efforts to proofread, errors can occur which can change the meaning. Any transcriptional errors that result from this process are unintentional and may not be fully corrected at the time of dictation.

## 2023-06-15 NOTE — Telephone Encounter (Signed)
 For the code [id number] Auth # [id number] valid 06/14/23 to 09/12/2023

## 2023-06-15 NOTE — Telephone Encounter (Signed)
 Noted. Nothing further needed.

## 2023-06-29 ENCOUNTER — Other Ambulatory Visit: Payer: Self-pay

## 2023-06-29 ENCOUNTER — Encounter
Admission: RE | Admit: 2023-06-29 | Discharge: 2023-06-29 | Disposition: A | Discharge status: Home / Self Care | Source: Ambulatory Visit | Attending: Pulmonary Disease | Admitting: Pulmonary Disease

## 2023-06-29 DIAGNOSIS — I619 Nontraumatic intracerebral hemorrhage, unspecified: Secondary | ICD-10-CM

## 2023-06-29 DIAGNOSIS — I10 Essential (primary) hypertension: Secondary | ICD-10-CM

## 2023-06-29 DIAGNOSIS — R911 Solitary pulmonary nodule: Secondary | ICD-10-CM

## 2023-06-29 HISTORY — DX: Cerebral infarction, unspecified: I63.9

## 2023-06-29 NOTE — Patient Instructions (Addendum)
 Your procedure is scheduled on: 07/04/23 - Monday Report to the Registration Desk on the 1st floor of the [facility]. To find out your arrival time, please call [phone] between 1PM - 3PM on: 07/01/23 - Friday If your arrival time is 6:00 am, do not arrive before that time as the [facility] entrance doors do not open until 6:00 am.  REMEMBER: Instructions that are not followed completely may result in serious medical risk, up to and including death; or upon the discretion of your surgeon and anesthesiologist your surgery may need to be rescheduled.  Do not eat food or drink any liquids after midnight the night before surgery.  No gum chewing or hard candies.  One week prior to surgery: Stop beginning 06/29/23, Anti-inflammatories (NSAIDS) such as Advil, Aleve, Ibuprofen, Motrin, Naproxen, Naprosyn and Aspirin based products such as Excedrin, Goody's Powder, BC Powder.  Stop beginning 05/28,  ANY OVER THE COUNTER supplements until after surgery.  You may continue to take Tylenol  if needed for pain up until the day of surgery.  Continue taking all of your other prescription medications up until the day before surgery.  ON THE DAY OF SURGERY ONLY TAKE THESE MEDICATIONS WITH SIPS OF WATER:  none   No Alcohol for 24 hours before or after surgery.  No Smoking including e-cigarettes for 24 hours before surgery.  No chewable tobacco products for at least 6 hours before surgery.  No nicotine patches on the day of surgery.  Do not use any "recreational" drugs for at least a week (preferably 2 weeks) before your surgery.  Please be advised that the combination of cocaine and anesthesia may have negative outcomes, up to and including death. If you test positive for cocaine, your surgery will be cancelled.  On the morning of surgery brush your teeth with toothpaste and water, you may rinse your mouth with mouthwash if you wish. Do not swallow any toothpaste or mouthwash.   Do not  wear jewelry, make-up, hairpins, clips or nail polish.  For welded (permanent) jewelry: bracelets, anklets, waist bands, etc.  Please have this removed prior to surgery.  If it is not removed, there is a chance that hospital personnel will need to cut it off on the day of surgery.  Do not wear lotions, powders, or perfumes.   Do not shave body hair from the neck down 48 hours before surgery.  Contact lenses, hearing aids and dentures may not be worn into surgery.  Do not bring valuables to the hospital. [facility] is not responsible for any missing/lost belongings or valuables.   Notify your doctor if there is any change in your medical condition (cold, fever, infection).  Wear comfortable clothing (specific to your surgery type) to the hospital.  After surgery, you can help prevent lung complications by doing breathing exercises.  Take deep breaths and cough every 1-2 hours. Your doctor may order a device called an Incentive Spirometer to help you take deep breaths.  When coughing or sneezing, hold a pillow firmly against your incision with both hands. This is called "splinting." Doing this helps protect your incision. It also decreases belly discomfort.  If you are being admitted to the hospital overnight, leave your suitcase in the car. After surgery it may be brought to your room.  In case of increased patient census, it may be necessary for you, the patient, to continue your postoperative care in the Same Day Surgery department.  If you are being discharged the day of  surgery, you will not be allowed to drive home. You will need a responsible individual to drive you home and stay with you for 24 hours after surgery.   If you are taking public transportation, you will need to have a responsible individual with you.  Please call the Pre-admissions Testing Dept. at [phone] if you have any questions about these instructions.  Surgery Visitation Policy:  Patients having  surgery or a procedure may have two visitors.  Children under the age of 61 must have an adult with them who is not the patient.  Inpatient Visitation:    Visiting hours are 7 a.m. to 8 p.m. Up to four visitors are allowed at one time in a patient room. The visitors may rotate out with other people during the day.  One visitor age 20 or older may stay with the patient overnight and must be in the room by 8 p.m.

## 2023-07-01 ENCOUNTER — Encounter
Admission: RE | Admit: 2023-07-01 | Discharge: 2023-07-01 | Disposition: A | Discharge status: Home / Self Care | Source: Ambulatory Visit | Attending: Pulmonary Disease | Admitting: Pulmonary Disease

## 2023-07-01 ENCOUNTER — Ambulatory Visit
Admission: RE | Admit: 2023-07-01 | Discharge: 2023-07-01 | Disposition: A | Discharge status: Home / Self Care | Source: Ambulatory Visit | Attending: Pulmonary Disease | Admitting: Pulmonary Disease

## 2023-07-01 ENCOUNTER — Encounter: Payer: Self-pay | Admitting: Pulmonary Disease

## 2023-07-01 DIAGNOSIS — I10 Essential (primary) hypertension: Secondary | ICD-10-CM | POA: Insufficient documentation

## 2023-07-01 DIAGNOSIS — R911 Solitary pulmonary nodule: Secondary | ICD-10-CM | POA: Insufficient documentation

## 2023-07-01 DIAGNOSIS — I619 Nontraumatic intracerebral hemorrhage, unspecified: Secondary | ICD-10-CM | POA: Insufficient documentation

## 2023-07-01 DIAGNOSIS — Z01818 Encounter for other preprocedural examination: Secondary | ICD-10-CM | POA: Insufficient documentation

## 2023-07-01 HISTORY — DX: Age-related osteoporosis without current pathological fracture: M81.0

## 2023-07-01 HISTORY — DX: Atherosclerotic heart disease of native coronary artery without angina pectoris: I25.10

## 2023-07-01 HISTORY — DX: Thoracic aortic aneurysm, without rupture, unspecified: I71.20

## 2023-07-01 HISTORY — DX: Other ill-defined heart diseases: I51.89

## 2023-07-01 HISTORY — DX: Unspecified malignant neoplasm of skin, unspecified: C44.90

## 2023-07-01 HISTORY — DX: Atherosclerosis of aorta: I70.0

## 2023-07-01 LAB — CBC
HCT: 35.6 % — ABNORMAL LOW (ref 36.0–46.0)
Hemoglobin: 12.9 g/dL (ref 12.0–15.0)
MCH: 31 pg (ref 26.0–34.0)
MCHC: 36.2 g/dL — ABNORMAL HIGH (ref 30.0–36.0)
MCV: 85.6 fL (ref 80.0–100.0)
Platelets: 180 10*3/uL (ref 150–400)
RBC: 4.16 MIL/uL (ref 3.87–5.11)
RDW: 12.3 % (ref 11.5–15.5)
WBC: 6.7 10*3/uL (ref 4.0–10.5)
nRBC: 0 % (ref 0.0–0.2)

## 2023-07-04 ENCOUNTER — Encounter: Payer: Self-pay | Admitting: Pulmonary Disease

## 2023-07-04 ENCOUNTER — Ambulatory Visit

## 2023-07-04 ENCOUNTER — Other Ambulatory Visit: Payer: Self-pay

## 2023-07-04 ENCOUNTER — Encounter
Admission: RE | Disposition: A | Discharge status: Home / Self Care | Payer: Self-pay | Source: Home / Self Care | Attending: Pulmonary Disease

## 2023-07-04 ENCOUNTER — Ambulatory Visit
Admission: RE | Admit: 2023-07-04 | Discharge: 2023-07-04 | Disposition: A | Discharge status: Home / Self Care | Attending: Pulmonary Disease | Admitting: Pulmonary Disease

## 2023-07-04 ENCOUNTER — Ambulatory Visit: Payer: Self-pay | Admitting: Urgent Care

## 2023-07-04 ENCOUNTER — Ambulatory Visit: Admitting: Anesthesiology

## 2023-07-04 DIAGNOSIS — I712 Thoracic aortic aneurysm, without rupture, unspecified: Secondary | ICD-10-CM | POA: Insufficient documentation

## 2023-07-04 DIAGNOSIS — I700 Atherosclerosis of aorta: Secondary | ICD-10-CM | POA: Diagnosis not present

## 2023-07-04 DIAGNOSIS — R911 Solitary pulmonary nodule: Secondary | ICD-10-CM | POA: Diagnosis present

## 2023-07-04 DIAGNOSIS — I110 Hypertensive heart disease with heart failure: Secondary | ICD-10-CM | POA: Insufficient documentation

## 2023-07-04 DIAGNOSIS — Z87891 Personal history of nicotine dependence: Secondary | ICD-10-CM | POA: Insufficient documentation

## 2023-07-04 DIAGNOSIS — I5030 Unspecified diastolic (congestive) heart failure: Secondary | ICD-10-CM | POA: Diagnosis not present

## 2023-07-04 HISTORY — PX: BRONCHOSCOPY, WITH BIOPSY USING ELECTROMAGNETIC NAVIGATION: SHX7536

## 2023-07-04 SURGERY — BRONCHOSCOPY, WITH BIOPSY USING ELECTROMAGNETIC NAVIGATION
Anesthesia: General | Laterality: Right

## 2023-07-04 MED ORDER — MIDAZOLAM HCL 2 MG/2ML IJ SOLN
INTRAMUSCULAR | Status: DC | PRN
  Administered 2023-07-04: 2 mg via INTRAVENOUS

## 2023-07-04 MED ORDER — FENTANYL CITRATE (PF) 100 MCG/2ML IJ SOLN
INTRAMUSCULAR | Status: AC
  Filled 2023-07-04: qty 2

## 2023-07-04 MED ORDER — MIDAZOLAM HCL 2 MG/2ML IJ SOLN
INTRAMUSCULAR | Status: AC
  Filled 2023-07-04: qty 2

## 2023-07-04 MED ORDER — LACTATED RINGERS IV SOLN: INTRAVENOUS | Status: DC

## 2023-07-04 MED ORDER — ROCURONIUM BROMIDE 100 MG/10ML IV SOLN
INTRAVENOUS | Status: DC | PRN
  Administered 2023-07-04: 10 mg via INTRAVENOUS
  Administered 2023-07-04: 50 mg via INTRAVENOUS

## 2023-07-04 MED ORDER — ORAL CARE MOUTH RINSE: 15.0000 mL | Freq: Once | OROMUCOSAL | Status: AC

## 2023-07-04 MED ORDER — CHLORHEXIDINE GLUCONATE 0.12 % MT SOLN
OROMUCOSAL | Status: AC
  Filled 2023-07-04: qty 15

## 2023-07-04 MED ORDER — DEXAMETHASONE SODIUM PHOSPHATE 10 MG/ML IJ SOLN
INTRAMUSCULAR | Status: DC | PRN
Start: 2023-07-04 — End: 2023-07-04
  Administered 2023-07-04: 5 mg via INTRAVENOUS

## 2023-07-04 MED ORDER — SUGAMMADEX SODIUM 200 MG/2ML IV SOLN
INTRAVENOUS | Status: DC | PRN
  Administered 2023-07-04: 200 mg via INTRAVENOUS

## 2023-07-04 MED ORDER — FENTANYL CITRATE (PF) 100 MCG/2ML IJ SOLN: 50.0000 ug | INTRAMUSCULAR | Status: DC | PRN

## 2023-07-04 MED ORDER — LIDOCAINE HCL (PF) 2 % IJ SOLN
INTRAMUSCULAR | Status: AC
Start: 2023-07-04 — End: ?
  Filled 2023-07-04: qty 5

## 2023-07-04 MED ORDER — IPRATROPIUM-ALBUTEROL 0.5-2.5 (3) MG/3ML IN SOLN
RESPIRATORY_TRACT | Status: AC
  Filled 2023-07-04: qty 3

## 2023-07-04 MED ORDER — PHENYLEPHRINE 80 MCG/ML (10ML) SYRINGE FOR IV PUSH (FOR BLOOD PRESSURE SUPPORT)
PREFILLED_SYRINGE | INTRAVENOUS | Status: DC | PRN
Start: 2023-07-04 — End: 2023-07-04
  Administered 2023-07-04 (×3): 80 ug via INTRAVENOUS

## 2023-07-04 MED ORDER — ROCURONIUM BROMIDE 10 MG/ML (PF) SYRINGE
PREFILLED_SYRINGE | INTRAVENOUS | Status: AC
Start: 2023-07-04 — End: ?
  Filled 2023-07-04: qty 10

## 2023-07-04 MED ORDER — CHLORHEXIDINE GLUCONATE 0.12 % MT SOLN
15.0000 mL | Freq: Once | OROMUCOSAL | Status: AC
  Administered 2023-07-04: 15 mL via OROMUCOSAL

## 2023-07-04 MED ORDER — DEXAMETHASONE SODIUM PHOSPHATE 10 MG/ML IJ SOLN
INTRAMUSCULAR | Status: AC
  Filled 2023-07-04: qty 1

## 2023-07-04 MED ORDER — ONDANSETRON HCL 4 MG/2ML IJ SOLN
INTRAMUSCULAR | Status: DC | PRN
  Administered 2023-07-04: 4 mg via INTRAVENOUS

## 2023-07-04 MED ORDER — ONDANSETRON HCL 4 MG/2ML IJ SOLN: 4.0000 mg | Freq: Once | INTRAMUSCULAR | Status: DC | PRN

## 2023-07-04 MED ORDER — EPHEDRINE SULFATE-NACL 50-0.9 MG/10ML-% IV SOSY
PREFILLED_SYRINGE | INTRAVENOUS | Status: DC | PRN
Start: 2023-07-04 — End: 2023-07-04
  Administered 2023-07-04: 5 mg via INTRAVENOUS
  Administered 2023-07-04 (×3): 10 mg via INTRAVENOUS

## 2023-07-04 MED ORDER — FENTANYL CITRATE (PF) 100 MCG/2ML IJ SOLN
INTRAMUSCULAR | Status: DC | PRN
  Administered 2023-07-04 (×2): 50 ug via INTRAVENOUS

## 2023-07-04 MED ORDER — LIDOCAINE HCL (CARDIAC) PF 100 MG/5ML IV SOSY
PREFILLED_SYRINGE | INTRAVENOUS | Status: DC | PRN
  Administered 2023-07-04: 60 mg via INTRAVENOUS

## 2023-07-04 MED ORDER — ONDANSETRON HCL 4 MG/2ML IJ SOLN
INTRAMUSCULAR | Status: AC
  Filled 2023-07-04: qty 2

## 2023-07-04 MED ORDER — METOPROLOL TARTRATE 5 MG/5ML IV SOLN
INTRAVENOUS | Status: AC
  Filled 2023-07-04: qty 5

## 2023-07-04 MED ORDER — IPRATROPIUM-ALBUTEROL 0.5-2.5 (3) MG/3ML IN SOLN
3.0000 mL | Freq: Once | RESPIRATORY_TRACT | Status: AC
  Administered 2023-07-04: 3 mL via RESPIRATORY_TRACT

## 2023-07-04 MED ORDER — LABETALOL HCL 5 MG/ML IV SOLN
INTRAVENOUS | Status: DC | PRN
  Administered 2023-07-04: 5 mg via INTRAVENOUS

## 2023-07-04 MED ORDER — PROPOFOL 10 MG/ML IV BOLUS
INTRAVENOUS | Status: DC | PRN
  Administered 2023-07-04: 120 mg via INTRAVENOUS

## 2023-07-04 MED ORDER — SODIUM CHLORIDE 0.9 % IV SOLN: Freq: Once | INTRAVENOUS | Status: AC

## 2023-07-04 MED ORDER — LABETALOL HCL 5 MG/ML IV SOLN
INTRAVENOUS | Status: AC
Start: 2023-07-04 — End: ?
  Filled 2023-07-04: qty 4

## 2023-07-04 MED ORDER — LACTATED RINGERS IV SOLN: INTRAVENOUS | Status: DC | PRN

## 2023-07-04 MED ORDER — EPHEDRINE 5 MG/ML INJ
INTRAVENOUS | Status: AC
  Filled 2023-07-04: qty 5

## 2023-07-04 MED ORDER — PROPOFOL 10 MG/ML IV BOLUS
INTRAVENOUS | Status: AC
Start: 2023-07-04 — End: ?
  Filled 2023-07-04: qty 20

## 2023-07-04 NOTE — Transfer of Care (Signed)
 Immediate Anesthesia Transfer of Care Note  Patient: Paige Griffin  Procedure(s) Performed: BRONCHOSCOPY, WITH BIOPSY USING ELECTROMAGNETIC NAVIGATION (Right)  Patient Location: PACU  Anesthesia Type:General  Level of Consciousness: awake and alert   Airway & Oxygen Therapy: Patient Spontanous Breathing and Patient connected to nasal cannula oxygen  Post-op Assessment: Report given to RN and Post -op Vital signs reviewed and stable  Post vital signs: Reviewed and stable  Last Vitals:  Vitals Value Taken Time  BP 158/79 07/04/23 1000  Temp 36.2 C 07/04/23 0955  Pulse 67 07/04/23 1005  Resp 13 07/04/23 1005  SpO2 98 % 07/04/23 1005  Vitals shown include unfiled device data.  Last Pain:  Vitals:   07/04/23 0955  TempSrc:   PainSc: 0-No pain         Complications: No notable events documented.

## 2023-07-04 NOTE — Anesthesia Preprocedure Evaluation (Addendum)
 Anesthesia Evaluation  Patient identified by MRN, date of birth, ID band Patient awake    Reviewed: Allergy & Precautions, NPO status , Patient's Chart, lab work & pertinent test results  History of Anesthesia Complications Negative for: history of anesthetic complications  Airway Mallampati: II   Neck ROM: Full    Dental  (+) Upper Dentures, Lower Dentures   Pulmonary former smoker (quit 2020) Lung CA   Pulmonary exam normal breath sounds clear to auscultation       Cardiovascular hypertension, + Peripheral Vascular Disease (TAA)  Normal cardiovascular exam Rhythm:Regular Rate:Normal  ECG 07/01/23:  Normal sinus rhythm Nonspecific ST abnormality   Neuro/Psych CVA (01/2023), No Residual Symptoms    GI/Hepatic negative GI ROS,,,  Endo/Other  negative endocrine ROS    Renal/GU negative Renal ROS     Musculoskeletal   Abdominal   Peds  Hematology negative hematology ROS (+)   Anesthesia Other Findings   Reproductive/Obstetrics                             Anesthesia Physical Anesthesia Plan  ASA: 3  Anesthesia Plan: General   Post-op Pain Management:    Induction: Intravenous  PONV Risk Score and Plan: 3 and Ondansetron, Dexamethasone and Treatment may vary due to age or medical condition  Airway Management Planned: Oral ETT  Additional Equipment:   Intra-op Plan:   Post-operative Plan: Extubation in OR  Informed Consent: I have reviewed the patients History and Physical, chart, labs and discussed the procedure including the risks, benefits and alternatives for the proposed anesthesia with the patient or authorized representative who has indicated his/her understanding and acceptance.     Dental advisory given  Plan Discussed with: CRNA  Anesthesia Plan Comments: (Patient consented for risks of anesthesia including but not limited to:  - adverse reactions to  medications - damage to eyes, teeth, lips or other oral mucosa - nerve damage due to positioning  - sore throat or hoarseness - damage to heart, brain, nerves, lungs, other parts of body or loss of life  Informed patient about role of CRNA in peri- and intra-operative care.  Patient voiced understanding.)        Anesthesia Quick Evaluation

## 2023-07-04 NOTE — Anesthesia Postprocedure Evaluation (Signed)
 Anesthesia Post Note  Patient: Paige Griffin  Procedure(s) Performed: BRONCHOSCOPY, WITH BIOPSY USING ELECTROMAGNETIC NAVIGATION (Right)  Patient location during evaluation: PACU Anesthesia Type: General Level of consciousness: awake and alert Pain management: pain level controlled Vital Signs Assessment: post-procedure vital signs reviewed and stable Respiratory status: spontaneous breathing, nonlabored ventilation, respiratory function stable and patient connected to nasal cannula oxygen Cardiovascular status: blood pressure returned to baseline and stable Postop Assessment: no apparent nausea or vomiting Anesthetic complications: no  No notable events documented.   Last Vitals:  Vitals:   07/04/23 1015 07/04/23 1028  BP: (!) 158/84 (!) 158/88  Pulse: 67 67  Resp: 12 18  Temp:  36.4 C  SpO2: 98% 99%    Last Pain:  Vitals:   07/04/23 1028  TempSrc: Temporal  PainSc: 0-No pain                 Enrique Harvest

## 2023-07-04 NOTE — Interval H&P Note (Signed)
 Leva Rayas has presented today for surgery, with the diagnosis of right upper lobe nodule/mass.  The various methods of treatment have been discussed with the patient and family. After consideration of risks, benefits and other options for treatment, the patient has consented to  Procedure(s): ROBOTIC ASSISTED NAVIGATIONAL BRONCHOSCOPY AND ENDOBRONCHIAL ULTRASOUND-RIGHT.  As a surgical intervention.  The patient's history has been reviewed, patient examined, no change in status, stable for surgery.  I have reviewed the patient's chart and labs.  Questions were answered to the patient's satisfaction.   Benefits, limitations and potential complications of the procedure were discussed with the patient/family.  Complications from bronchoscopy are rare and most often minor, but if they occur they may include breathing difficulty, vocal cord spasm, hoarseness, slight fever, vomiting, dizziness, bronchospasm, infection, low blood oxygen, bleeding from biopsy site, or an allergic reaction to medications.  It is uncommon for patients to experience other more serious complications for example: Collapsed lung requiring chest tube placement, respiratory failure, heart attack and/or cardiac arrhythmia.  Patient agrees to proceed.   Acey Ace, MD Advanced Bronchoscopy PCCM [facility]    *This note was generated using voice recognition software/Dragon and/or AI transcription program.  Despite best efforts to proofread, errors can occur which can change the meaning. Any transcriptional errors that result from this process are unintentional and may not be fully corrected at the time of dictation.

## 2023-07-04 NOTE — Op Note (Addendum)
 PROCEDURES Robotic assisted bronchoscopy Cellvizio probe based confocal laser endomicroscopy (pCLE) 3D C arm CBCT (GE OEC 3D)   Indication: Right upper lobe nodule rule out cancer versus infectious etiology  Preoperative Diagnosis: Right upper lobe nodule 2.2 x 1.6 cm, rule out cancer versus infectious etiology Post Procedure Diagnosis: Same as above, favor infectious etiology  Consent: Verbal/Written: obtained  Benefits, limitations and potential complications of the procedure were discussed with the patient/family.  Complications from bronchoscopy are rare and most often minor, but if they occur they may include breathing difficulty, vocal cord spasm, hoarseness, slight fever, vomiting, dizziness, bronchospasm, infection, low blood oxygen, bleeding from biopsy site, or an allergic reaction to medications.  It is uncommon for patients to experience other more serious complications for example: Collapsed lung requiring chest tube placement, respiratory failure, heart attack and/or cardiac arrhythmia.  Patient understood the potential complications and agreed to proceed.  Surgeon: Acey Ace, MD Assistant/Scrub: Suszanne Eriksson, RRT Circulator: N/A Anesthesiologist/CRNA: Enrique Harvest, MD/Philip Neldon [place], CRNA Cytotechnology: [facility]ology Fluoroscopy technician: Lem Pyle, RT Representatives: Dick Fothergill, Intuitive/Ion.  Ion Proctor: Boyd Cabal, MD  Type of Anesthesia: General endotracheal  Procedures Performed:   Robotic bronchoscopy: Procedure consists of robotic navigation comprised of shape sensing/optical pattern recognition and robotic kinematic data - to triangulate bronchoscope location during the procedure and provide accurate positional data to biopsy a lesion. Cellvizio probe based confocal laser endomicroscopy (pCLE) utilizing blue laser endomicroscopy. 3D CBCT.  Description of Procedure:  Robotic bronchoscopy: The patient was brought to Procedure  Room 2 (Bronchoscopy Suite) in the OR area where appropriate timeout was taken with the staff after the patient was inducted under general anesthesia.  The patient was inducted under general anesthesia and intubated by the anesthesia team.  Patient was intubated with a 8.5 ET tube without difficulty.  Tube was secured at 4 cm above the carina.  An Ion robotic bronchoscope adapter was placed on the ET tube flange.  Once the patient was under adequate general anesthesia the Olympus diagnostic video bronchoscope was advanced and an anatomic airway tour and surveillance bronchoscopy was performed.The distal trachea appeared unremarkable. The main carina was sharp.  There were copious secretions seen on both right and left mainstem bronchi.  These were suctioned/lavaged until clear.  The RUL, RLL, RML appeared to be free of endobronchial masses, lesions, or purulent secretions. Likewise, the LLL/LUL appeared to be free of endobronchial masses, lesions, or purulent secretions.  There were some copious benign appearing secretions on the right upper lobe that were suctioned until cleared.  Once the survey bronchoscopy was completed,the robotic bronchoscope catheter was advanced through the ET tube adapter was placed and the catheter was then once to perform registration.  Registration for the robotic bronchoscopy was then performed.  The registration had to be performed twice due to issues with copious secretions obscuring the airway and having to reposition the ET tube holder.  Once this was completed, there was good correlation between the robotic mapping and bronchoscopic mapping. With the assistance of shape sensing navigation, the bronchoscope was advanced to the RUL nodule/mass. The tip of the working channel sat within 19 mm of the nodule  Positioning was confirmed with 3D CBCT.  The biopsy needle was then advanced and an additional spin with the 3D CBCT was performed to confirm tool in lesion.  At this point  Cellvizio probe based confocal laser endomicroscopy (pCLE) was utilized to confirm target acquisition.  The images through Cellvizio were consistent with lesional changes.  At this point utilizing an Ion 21-gauge transbronchial biopsy needle a total of 6 passes were performed.  Material obtained was noted to be "black" per cytotechnologist.  Suspicious for potential fungal etiology.  Additional specimens were collected for culture.  The robotic bronchoscope was then retracted all the way out after confirmation of excellent hemostasis.  The patient received bronchial lavage with 9 mL of 1% lidocaine via the ET tube. The patient tolerated the procedure well. No significant bleeding was observed at the conclusion of the procedure.  At this point, the patient was allowed to emerge from general anesthesia, and was extubated in the procedure room without incident.  The patient  was taken to the PACU in satisfactory condition.  Auscultation of the lungs showed no change from pre bronchoscopy examination.  Patient tolerated the procedure very well with no untoward effects of anesthesia noted.   Specimens Obtained:  Transbronchial Forceps Biopsy: N/A  Transbronchial Brush: N/A  TBNA: X 6, RUL additional past was obtained for culture.   Fluoroscopy: 3D CBCT was utilized during the course of this procedure to assure that biopsies were taken in a safe manner under fluoroscopic guidance with spot films required.  Total fluoroscopy time: 3 minutes 33 seconds, 57.9 mGy.  Intraoperative images:  Robotic bronchoscope catheter in position towards target   During biopsies   Tool in lesion scan   Cellvizio image of lesion:   Complications:None, no pneumothorax on post film:   Estimated Blood Loss: Nil    Assessment and Plan/Additional Comments: Right upper lobe nodule/mass, cancer versus infectious, favor potential fungal etiology Await pathology/microbiology reports Patient has appropriate  Pulmonary Medicine follow-up     C. Chloe Counter, MD Advanced Bronchoscopy PCCM [facility]    *This note was dictated using voice recognition software/Dragon.  Despite best efforts to proofread, errors can occur which can change the meaning.  Any change was purely unintentional.

## 2023-07-04 NOTE — Anesthesia Procedure Notes (Signed)
 Procedure Name: Intubation Date/Time: 07/04/2023 8:03 AM  Performed by: Bill Budd, CRNAPre-anesthesia Checklist: Patient identified, Patient being monitored, Timeout performed, Emergency Drugs available and Suction available Patient Re-evaluated:Patient Re-evaluated prior to induction Oxygen Delivery Method: Circle system utilized Preoxygenation: Pre-oxygenation with 100% oxygen Induction Type: IV induction Ventilation: Mask ventilation without difficulty Laryngoscope Size: Mac and 4 Grade View: Grade I Tube type: Oral Tube size: 8.5 mm Number of attempts: 1 Airway Equipment and Method: Stylet Placement Confirmation: ETT inserted through vocal cords under direct vision, positive ETCO2 and breath sounds checked- equal and bilateral Secured at: 21 cm Tube secured with: Tape Dental Injury: Teeth and Oropharynx as per pre-operative assessment

## 2023-07-05 ENCOUNTER — Encounter: Payer: Self-pay | Admitting: Pulmonary Disease

## 2023-07-05 LAB — CYTOLOGY - NON PAP

## 2023-07-06 ENCOUNTER — Ambulatory Visit: Payer: Self-pay | Admitting: Pulmonary Disease

## 2023-07-06 DIAGNOSIS — B449 Aspergillosis, unspecified: Secondary | ICD-10-CM

## 2023-07-06 DIAGNOSIS — Z01811 Encounter for preprocedural respiratory examination: Secondary | ICD-10-CM

## 2023-07-06 LAB — FUNGUS CULTURE RESULT

## 2023-07-06 LAB — CULTURE, RESPIRATORY W GRAM STAIN
Culture: NO GROWTH
Gram Stain: NONE SEEN

## 2023-07-06 LAB — FUNGUS CULTURE WITH STAIN

## 2023-07-06 NOTE — Progress Notes (Signed)
 PFT scheduled for 07/19/23 at 9am.

## 2023-07-19 ENCOUNTER — Encounter

## 2023-07-20 ENCOUNTER — Ambulatory Visit: Admitting: Pulmonary Disease

## 2023-08-01 ENCOUNTER — Other Ambulatory Visit: Payer: Self-pay

## 2023-08-01 ENCOUNTER — Emergency Department

## 2023-08-01 ENCOUNTER — Encounter: Payer: Self-pay | Admitting: *Deleted

## 2023-08-01 ENCOUNTER — Emergency Department
Admission: EM | Admit: 2023-08-01 | Discharge: 2023-08-01 | Disposition: A | Discharge status: Home / Self Care | Attending: Emergency Medicine | Admitting: Emergency Medicine

## 2023-08-01 DIAGNOSIS — S52502A Unspecified fracture of the lower end of left radius, initial encounter for closed fracture: Secondary | ICD-10-CM | POA: Diagnosis not present

## 2023-08-01 DIAGNOSIS — I10 Essential (primary) hypertension: Secondary | ICD-10-CM | POA: Insufficient documentation

## 2023-08-01 DIAGNOSIS — W08XXXA Fall from other furniture, initial encounter: Secondary | ICD-10-CM | POA: Insufficient documentation

## 2023-08-01 DIAGNOSIS — Z8673 Personal history of transient ischemic attack (TIA), and cerebral infarction without residual deficits: Secondary | ICD-10-CM | POA: Insufficient documentation

## 2023-08-01 DIAGNOSIS — S6992XA Unspecified injury of left wrist, hand and finger(s), initial encounter: Secondary | ICD-10-CM | POA: Diagnosis present

## 2023-08-01 MED ORDER — FENTANYL CITRATE PF 50 MCG/ML IJ SOSY
50.0000 ug | PREFILLED_SYRINGE | Freq: Once | INTRAMUSCULAR | Status: AC
  Administered 2023-08-01: 50 ug via INTRAVENOUS
  Filled 2023-08-01: qty 1

## 2023-08-01 MED ORDER — MIDAZOLAM HCL 2 MG/2ML IJ SOLN
2.0000 mg | Freq: Once | INTRAMUSCULAR | Status: AC
  Administered 2023-08-01: 2 mg via INTRAVENOUS
  Filled 2023-08-01: qty 2

## 2023-08-01 MED ORDER — MIDAZOLAM HCL 2 MG/2ML IJ SOLN
2.0000 mg | Freq: Once | INTRAMUSCULAR | Status: DC
  Filled 2023-08-01: qty 2

## 2023-08-01 MED ORDER — CEPHALEXIN 500 MG PO CAPS: 500.0000 mg | ORAL_CAPSULE | Freq: Four times a day (QID) | ORAL | 0 refills | Status: AC

## 2023-08-01 NOTE — Sedation Documentation (Signed)
 Patient's L arm splinted at this time.

## 2023-08-01 NOTE — ED Provider Notes (Signed)
 [facility] Provider Note    Event Date/Time   First MD Initiated Contact with Patient 08/01/23 1614     (approximate)   History   Wrist Pain   HPI  Paige Griffin is a 78 y.o. female with history of hypertension, hemorrhagic stroke, UTI who presents with a left wrist injury, acute onset just prior to coming to the ED when she fell off of a stepstool.  The patient denies hitting her head and has no other injuries.  She has a deformity to the left wrist but can feel her hand.  I reviewed the past medical records.  The patient's most recent outpatient encounter was with pulmonology on 6/2 for a right upper lobe bronchoscopy to evaluate a nodule.   Physical Exam   Triage Vital Signs: ED Triage Vitals [08/01/23 1608]  Encounter Vitals Group     BP (!) 154/91     Girls Systolic BP Percentile      Girls Diastolic BP Percentile      Boys Systolic BP Percentile      Boys Diastolic BP Percentile      Pulse Rate 80     Resp 18     Temp 98 F (36.7 C)     Temp Source Oral     SpO2 98 %     Weight 110 lb (49.9 kg)     Height 5' 5 (1.651 m)     Head Circumference      Peak Flow      Pain Score 0     Pain Loc      Pain Education      Exclude from Growth Chart     Most recent vital signs: Vitals:   08/01/23 1930 08/01/23 1945  BP: (!) 180/157 (!) 171/110  Pulse: 89 84  Resp: (!) 23 17  Temp:    SpO2:  98%     General: Awake, no distress.  CV:  Good peripheral perfusion.  Resp:  Normal effort.  Abd:  No distention.  Other:  Left wrist deformity.  2+ radial pulse.  Normal cap refill distally.  Motor and sensory intact in median, radial, and ulnar distributions.   ED Results / Procedures / Treatments   Labs (all labs ordered are listed, but only abnormal results are displayed) Labs Reviewed - No data to display   EKG     RADIOLOGY  XR L wrist: I independently viewed and interpreted the images; there is a displaced distal radius  fracture  PROCEDURES:  Critical Care performed: No  .Laceration Repair  Date/Time: 08/01/2023 8:43 PM  Performed by: Jacolyn Pae, MD Authorized by: Jacolyn Pae, MD   Consent:    Consent obtained:  Verbal   Consent given by:  Patient Anesthesia:    Anesthesia method:  None Laceration details:    Location:  Shoulder/arm   Shoulder/arm location:  L lower arm   Length (cm):  0.4 Treatment:    Area cleansed with:  Povidone-iodine   Amount of cleaning:  Standard Skin repair:    Repair method:  Tissue adhesive Approximation:    Approximation:  Close Repair type:    Repair type:  Simple Post-procedure details:    Dressing:  Sterile dressing   Procedure completion:  Tolerated well, no immediate complications .Sedation  Date/Time: 08/01/2023 8:44 PM  Performed by: Jacolyn Pae, MD Authorized by: Jacolyn Pae, MD   Consent:    Consent obtained:  Verbal (Patient unable to sign due to  injury)   Consent given by:  Patient   Risks discussed:  Dysrhythmia, prolonged sedation necessitating reversal, prolonged hypoxia resulting in organ damage, inadequate sedation, respiratory compromise necessitating ventilatory assistance and intubation and vomiting   Alternatives discussed:  Analgesia without sedation Universal protocol:    Immediately prior to procedure, a time out was called: yes     Patient identity confirmed:  Verbally with patient and arm band Indications:    Procedure performed:  Fracture reduction   Procedure necessitating sedation performed by:  Physician performing sedation Pre-sedation assessment:    Time since last food or drink:  4 hours   ASA classification: class 2 - patient with mild systemic disease     Mallampati score:  I - soft palate, uvula, fauces, pillars visible   Pre-sedation assessments completed and reviewed: airway patency, cardiovascular function, mental status, pain level and respiratory function     History of difficult  intubation: yes   A pre-sedation assessment was completed prior to the start of the procedure Procedure details (see MAR for exact dosages):    Sedation:  Midazolam    Intended level of sedation: moderate (conscious sedation)   Analgesia:  Fentanyl    Intra-procedure monitoring:  Blood pressure monitoring, continuous capnometry, frequent LOC assessments, frequent vital sign checks, continuous pulse oximetry and cardiac monitor   Intra-procedure events: none     Total Provider sedation time (minutes):  10 Post-procedure details:   A post-sedation assessment was completed following the completion of the procedure.   Attendance: Constant attendance by certified staff until patient recovered     Recovery: Patient returned to pre-procedure baseline     Procedure completion:  Tolerated well, no immediate complications .Ortho Injury Treatment  Date/Time: 08/01/2023 8:45 PM  Performed by: Jacolyn Pae, MD Authorized by: Jacolyn Pae, MD   Consent:    Consent obtained:  Verbal   Consent given by:  Patient   Risks discussed:  Fracture, nerve damage, restricted joint movement, vascular damage, irreducible dislocation and recurrent dislocation   Alternatives discussed:  ImmobilizationInjury location: wrist Location details: left wrist Injury type: fracture Fracture type: distal radius Pre-procedure neurovascular assessment: neurovascularly intact  Patient sedated: Yes. Refer to sedation procedure documentation for details of sedation. Immobilization: splint Splint type: sugar tong Splint Applied by: ED Provider Supplies used: Ortho-Glass Post-procedure neurovascular assessment: post-procedure neurovascularly intact      MEDICATIONS ORDERED IN ED: Medications  midazolam  (VERSED ) injection 2 mg (2 mg Intravenous Not Given 08/01/23 1857)  fentaNYL  (SUBLIMAZE ) injection 50 mcg (50 mcg Intravenous Given 08/01/23 1839)  fentaNYL  (SUBLIMAZE ) injection 50 mcg (50 mcg Intravenous  Given 08/01/23 1831)  midazolam  (VERSED ) injection 2 mg (2 mg Intravenous Given 08/01/23 1831)     IMPRESSION / MDM / ASSESSMENT AND PLAN / ED COURSE  I reviewed the triage vital signs and the nursing notes.  Differential diagnosis includes, but is not limited to, left wrist fracture.  This is confirmed by x-ray.  I consulted and discussed the case with Dr. Tobie from orthopedics who recommends closed reduction and a sugar-tong splint with orthopedic follow-up within the next week.  Patient's presentation is most consistent with acute presentation with potential threat to life or bodily function.  ----------------------------------------- 8:41 PM on 08/01/2023 -----------------------------------------  Reduction and splinting were successful.  The patient tolerated the procedure well and is now back to baseline after the sedation.  She is stable for discharge home.  On further examination before the splinting the patient had a 3 to 4 mm superficial appearing  laceration on the volar aspect of her wrist.  It was repaired with Dermabond. I did not palpate any bone directly under this although I cannot rule out that this laceration connects with the fracture.   Therefore I have started her on an antibiotic.  At this time, the patient is stable for discharge home.  She does not want any prescription for pain medication.  I gave her strict return precautions, she expressed understanding.  She will follow-up with [facility] clinic within the next week.  FINAL CLINICAL IMPRESSION(S) / ED DIAGNOSES   Final diagnoses:  Closed fracture of distal end of left radius, unspecified fracture morphology, initial encounter     Rx / DC Orders   ED Discharge Orders          Ordered    cephALEXin  (KEFLEX ) 500 MG capsule  4 times daily        08/01/23 2027             Note:  This document was prepared using Dragon voice recognition software and may include unintentional dictation errors.     Jacolyn Pae, MD 08/01/23 2046

## 2023-08-01 NOTE — Discharge Instructions (Signed)
 Call the [facility] orthopedic office tomorrow to arrange for follow-up within the next week.  Take the antibiotic as prescribed and finish the full 5-day course.  Return to the ER for new, worsening, or persistent severe pain, swelling, numbness, or any other new or worsening symptoms that concern you.

## 2023-08-01 NOTE — ED Triage Notes (Signed)
 Pt to triage via wheelchair.  Pt has obvious deformity to left wrist.  Pt fell off a stool trying to dust a ceiling fan.   No loc no neck or back pain.  Small open wound to wrist.  Good distal pulses and sensation of left wrist.  Pt alert.

## 2023-08-02 LAB — FUNGUS CULTURE WITH STAIN

## 2023-08-02 LAB — FUNGAL ORGANISM REFLEX

## 2023-08-16 ENCOUNTER — Ambulatory Visit

## 2023-08-23 ENCOUNTER — Encounter: Payer: Self-pay | Admitting: Orthopedic Surgery

## 2023-08-23 ENCOUNTER — Encounter: Payer: Self-pay | Admitting: Anesthesiology

## 2023-08-23 ENCOUNTER — Other Ambulatory Visit: Payer: Self-pay | Admitting: Orthopedic Surgery

## 2023-08-23 DIAGNOSIS — I340 Nonrheumatic mitral (valve) insufficiency: Secondary | ICD-10-CM | POA: Insufficient documentation

## 2023-08-23 DIAGNOSIS — I5189 Other ill-defined heart diseases: Secondary | ICD-10-CM | POA: Insufficient documentation

## 2023-08-23 DIAGNOSIS — I351 Nonrheumatic aortic (valve) insufficiency: Secondary | ICD-10-CM | POA: Insufficient documentation

## 2023-08-23 NOTE — Anesthesia Preprocedure Evaluation (Addendum)
 Anesthesia Evaluation  Patient identified by MRN, date of birth, ID band Patient awake    Reviewed: Allergy & Precautions, H&P , NPO status , Patient's Chart, lab work & pertinent test results  Airway Mallampati: III  TM Distance: >3 FB Neck ROM: Full    Dental no notable dental hx. (+) Edentulous Lower, Edentulous Upper   Pulmonary former smoker 05-10-23 CT MPRESSION: 1. Right apical nodule measures 2.4 x 2.1 cm today which compares to 2.2 x 2.1 cm when remeasured in a similar fashion on the prior study. No substantial change. 2. Ascending thoracic aortic aneurysm at 4 cm diameter. Recommend annual imaging followup by CTA or MRA. This recommendation follows 2010 ACCF/AHA/AATS/ACR/ASA/SCA/SCAI/SIR/STS/SVM Guidelines for the Diagnosis and Management of Patients with Thoracic Aortic Disease. Circulation. 2010; 121: Z733-z630. Aortic aneurysm NOS (ICD10-I71.9) 3. Aortic Atherosclerosis (ICD10-I70.0) and Emphysema (ICD10-J43.9).  07-04-23 Cardiovascular: Atherosclerosis of the aorta, great vessels and coronary arteries. The heart size is normal. Stable small pericardial effusion versus pericardial thickening.   Mediastinum/Nodes: There are no enlarged mediastinal, hilar or axillary lymph nodes.Hilar assessment is limited by the lack of intravenous contrast, although the hilar contours appear unchanged. The thyroid  gland, trachea and esophagus demonstrate no significant findings.   Lungs/Pleura: No pleural effusion or pneumothorax. The known right apical nodule measures 2.2 x 1.6 cm on image 22/14, not significantly changed from previous study. No new or enlarging pulmonary nodules identified. Stable mild centrilobular emphysema with additional scattered pulmonary scarring bilaterally.   Upper abdomen: The visualized upper abdomen appears stable, without acute findings. There is mild biliary dilatation post cholecystectomy, likely  physiologic. A probable small right renal cyst is unchanged; no specific follow-up imaging recommended.   Musculoskeletal/Chest wall: There is no chest wall mass or suspicious osseous finding. Mild thoracic spondylosis. Small amount of fluid within the right superior subscapularis recess.   IMPRESSION: 1. Imaging for bronchoscopy planning and guidance. 2. Stable right apical pulmonary nodule. No new or enlarging pulmonary nodules identified. 3. No evidence of metastatic disease. 4. Aortic Atherosclerosis (ICD10-I70.0) and Emphysema (ICD10-J43.9).    Pulmonary exam normal breath sounds clear to auscultation       Cardiovascular hypertension, + CAD  Normal cardiovascular exam Rhythm:Regular Rate:Normal  01-05-23 echo 1. Left ventricular ejection fraction, by estimation, is 60 to 65%. The  left ventricle has normal function. The left ventricle has no regional  wall motion abnormalities. There is mild left ventricular hypertrophy.  Left ventricular diastolic parameters  are consistent with Grade I diastolic dysfunction (impaired relaxation).   2. Right ventricular systolic function is normal. The right ventricular  size is normal. There is normal pulmonary artery systolic pressure. The  estimated right ventricular systolic pressure is 23.8 mmHg.   3. The mitral valve is normal in structure. Mild mitral valve  regurgitation. No evidence of mitral stenosis.   4. The aortic valve is tricuspid. Aortic valve regurgitation is mild.  Aortic valve sclerosis/calcification is present, without any evidence of  aortic stenosis.   5. The inferior vena cava is normal in size with greater than 50%  respiratory variability, suggesting right atrial pressure of 3 mmHg.      Neuro/Psych        Patient crying on admit; states she is feeling sorry for myself but denies being afraid of anything or upset by anything else. CVA negative neurological ROS  negative psych ROS    GI/Hepatic negative GI ROS, Neg liver ROS,,,  Endo/Other  negative endocrine ROS    Renal/GU negative Renal ROS  negative genitourinary   Musculoskeletal negative musculoskeletal ROS (+)    Abdominal   Peds negative pediatric ROS (+)  Hematology negative hematology ROS (+)   Anesthesia Other Findings Skin cancer  Hemorrhagic stroke (HCC) Hypertension  Nodule of upper lobe of right lung Aneurysm of cavernous portion of internal carotid artery  Hyperlipidemia Vitamin D deficiency  Vitamin B12 deficiency Thoracic aortic aneurysm  4 cm April 2025 Aortic atherosclerosis  Coronary artery calcification seen on CT scan Grade I Diastolic dysfunction Age related osteoporosis   Unfortunately do not feel comfortable with block due to aneurysm of cavernous portion of ICA.    Reproductive/Obstetrics negative OB ROS                              Anesthesia Physical Anesthesia Plan  ASA: 3  Anesthesia Plan: General   Post-op Pain Management:    Induction: Intravenous  PONV Risk Score and Plan:   Airway Management Planned: LMA  Additional Equipment:   Intra-op Plan:   Post-operative Plan: Extubation in OR  Informed Consent: I have reviewed the patients History and Physical, chart, labs and discussed the procedure including the risks, benefits and alternatives for the proposed anesthesia with the patient or authorized representative who has indicated his/her understanding and acceptance.     Dental Advisory Given  Plan Discussed with: Anesthesiologist, CRNA and Surgeon  Anesthesia Plan Comments: (Patient consented for risks of anesthesia including but not limited to:  - adverse reactions to medications - damage to eyes, teeth, lips or other oral mucosa - nerve damage due to positioning  - sore throat or hoarseness - Damage to heart, brain, nerves, lungs, other parts of body or loss of life  Patient voiced understanding and assent.)          Anesthesia Quick Evaluation

## 2023-08-26 ENCOUNTER — Other Ambulatory Visit: Payer: Self-pay

## 2023-08-26 ENCOUNTER — Encounter: Payer: Self-pay | Admitting: Orthopedic Surgery

## 2023-08-26 ENCOUNTER — Ambulatory Visit: Payer: Self-pay

## 2023-08-26 ENCOUNTER — Ambulatory Visit: Payer: Self-pay | Admitting: Anesthesiology

## 2023-08-26 ENCOUNTER — Encounter
Admission: RE | Disposition: A | Discharge status: Home / Self Care | Payer: Self-pay | Source: Home / Self Care | Attending: Orthopedic Surgery

## 2023-08-26 ENCOUNTER — Ambulatory Visit
Admission: RE | Admit: 2023-08-26 | Discharge: 2023-08-26 | Disposition: A | Discharge status: Home / Self Care | Attending: Orthopedic Surgery | Admitting: Orthopedic Surgery

## 2023-08-26 DIAGNOSIS — X58XXXA Exposure to other specified factors, initial encounter: Secondary | ICD-10-CM | POA: Insufficient documentation

## 2023-08-26 DIAGNOSIS — I10 Essential (primary) hypertension: Secondary | ICD-10-CM | POA: Diagnosis not present

## 2023-08-26 DIAGNOSIS — I7121 Aneurysm of the ascending aorta, without rupture: Secondary | ICD-10-CM | POA: Diagnosis not present

## 2023-08-26 DIAGNOSIS — I700 Atherosclerosis of aorta: Secondary | ICD-10-CM | POA: Insufficient documentation

## 2023-08-26 DIAGNOSIS — I2510 Atherosclerotic heart disease of native coronary artery without angina pectoris: Secondary | ICD-10-CM | POA: Diagnosis not present

## 2023-08-26 DIAGNOSIS — S52532A Colles' fracture of left radius, initial encounter for closed fracture: Secondary | ICD-10-CM | POA: Insufficient documentation

## 2023-08-26 DIAGNOSIS — J432 Centrilobular emphysema: Secondary | ICD-10-CM | POA: Insufficient documentation

## 2023-08-26 DIAGNOSIS — Z8673 Personal history of transient ischemic attack (TIA), and cerebral infarction without residual deficits: Secondary | ICD-10-CM | POA: Insufficient documentation

## 2023-08-26 DIAGNOSIS — Z87891 Personal history of nicotine dependence: Secondary | ICD-10-CM | POA: Diagnosis not present

## 2023-08-26 HISTORY — DX: Other ill-defined heart diseases: I51.89

## 2023-08-26 HISTORY — DX: Nonrheumatic mitral (valve) insufficiency: I34.0

## 2023-08-26 HISTORY — DX: Nonrheumatic aortic (valve) insufficiency: I35.1

## 2023-08-26 HISTORY — PX: OPEN REDUCTION INTERNAL FIXATION (ORIF) DISTAL RADIAL FRACTURE: SHX5989

## 2023-08-26 SURGERY — OPEN REDUCTION INTERNAL FIXATION (ORIF) DISTAL RADIUS FRACTURE
Anesthesia: General | Site: Wrist | Laterality: Left

## 2023-08-26 MED ORDER — DEXAMETHASONE SODIUM PHOSPHATE 4 MG/ML IJ SOLN
INTRAMUSCULAR | Status: DC | PRN
  Administered 2023-08-26: 4 mg via INTRAVENOUS

## 2023-08-26 MED ORDER — LACTATED RINGERS IV SOLN: INTRAVENOUS | Status: DC

## 2023-08-26 MED ORDER — CEFAZOLIN SODIUM-DEXTROSE 2-4 GM/100ML-% IV SOLN
2.0000 g | INTRAVENOUS | Status: AC
  Administered 2023-08-26: 2 g via INTRAVENOUS

## 2023-08-26 MED ORDER — SODIUM CHLORIDE 0.9 % IV SOLN: INTRAVENOUS | Status: DC | PRN

## 2023-08-26 MED ORDER — ROPIVACAINE HCL 5 MG/ML IJ SOLN
INTRAMUSCULAR | Status: DC | PRN
  Administered 2023-08-26: 15 mL via PERINEURAL

## 2023-08-26 MED ORDER — FENTANYL CITRATE (PF) 100 MCG/2ML IJ SOLN
INTRAMUSCULAR | Status: DC | PRN
  Administered 2023-08-26 (×2): 50 ug via INTRAVENOUS

## 2023-08-26 MED ORDER — SODIUM CHLORIDE 0.9 % IV SOLN: INTRAVENOUS | Status: DC

## 2023-08-26 MED ORDER — LIDOCAINE-EPINEPHRINE 1 %-1:100000 IJ SOLN
INTRAMUSCULAR | Status: AC
  Filled 2023-08-26: qty 1

## 2023-08-26 MED ORDER — BUPIVACAINE HCL (PF) 0.5 % IJ SOLN
INTRAMUSCULAR | Status: DC | PRN
  Administered 2023-08-26: 10 mL

## 2023-08-26 MED ORDER — PROPOFOL 10 MG/ML IV BOLUS
INTRAVENOUS | Status: DC | PRN
  Administered 2023-08-26: 150 mg via INTRAVENOUS

## 2023-08-26 MED ORDER — MIDAZOLAM HCL 5 MG/5ML IJ SOLN
INTRAMUSCULAR | Status: DC | PRN
  Administered 2023-08-26: 2 mg via INTRAVENOUS

## 2023-08-26 MED ORDER — LIDOCAINE HCL (CARDIAC) PF 100 MG/5ML IV SOSY
PREFILLED_SYRINGE | INTRAVENOUS | Status: DC | PRN
  Administered 2023-08-26: 50 mg via INTRATRACHEAL

## 2023-08-26 MED ORDER — ONDANSETRON HCL 4 MG/2ML IJ SOLN
INTRAMUSCULAR | Status: AC
  Filled 2023-08-26: qty 2

## 2023-08-26 MED ORDER — PHENYLEPHRINE HCL (PRESSORS) 10 MG/ML IV SOLN
INTRAVENOUS | Status: DC | PRN
  Administered 2023-08-26 (×5): 80 ug via INTRAVENOUS

## 2023-08-26 MED ORDER — ACETAMINOPHEN 10 MG/ML IV SOLN
INTRAVENOUS | Status: AC
  Filled 2023-08-26: qty 100

## 2023-08-26 MED ORDER — 0.9 % SODIUM CHLORIDE (POUR BTL) OPTIME
TOPICAL | Status: DC | PRN
Start: 2023-08-26 — End: 2023-08-26
  Administered 2023-08-26: 500 mL

## 2023-08-26 MED ORDER — LIDOCAINE HCL (PF) 2 % IJ SOLN
INTRAMUSCULAR | Status: AC
  Filled 2023-08-26: qty 5

## 2023-08-26 MED ORDER — ONDANSETRON HCL 4 MG/2ML IJ SOLN
INTRAMUSCULAR | Status: DC | PRN
Start: 2023-08-26 — End: 2023-08-26
  Administered 2023-08-26: 4 mg via INTRAVENOUS

## 2023-08-26 MED ORDER — MIDAZOLAM HCL 2 MG/2ML IJ SOLN
INTRAMUSCULAR | Status: AC
  Filled 2023-08-26: qty 2

## 2023-08-26 MED ORDER — FENTANYL CITRATE (PF) 100 MCG/2ML IJ SOLN
INTRAMUSCULAR | Status: AC
  Filled 2023-08-26: qty 2

## 2023-08-26 MED ORDER — DEXAMETHASONE SODIUM PHOSPHATE 10 MG/ML IJ SOLN
INTRAMUSCULAR | Status: DC | PRN
  Administered 2023-08-26: 10 mg

## 2023-08-26 MED ORDER — DEXAMETHASONE SODIUM PHOSPHATE 4 MG/ML IJ SOLN
INTRAMUSCULAR | Status: AC
  Filled 2023-08-26: qty 2

## 2023-08-26 MED ORDER — DEXAMETHASONE SODIUM PHOSPHATE 10 MG/ML IJ SOLN
INTRAMUSCULAR | Status: AC
  Filled 2023-08-26: qty 1

## 2023-08-26 MED ORDER — CEFAZOLIN SODIUM-DEXTROSE 2-3 GM-%(50ML) IV SOLR
INTRAVENOUS | Status: AC
  Filled 2023-08-26: qty 50

## 2023-08-26 MED ORDER — ACETAMINOPHEN 10 MG/ML IV SOLN
INTRAVENOUS | Status: DC | PRN
  Administered 2023-08-26: 1000 mg via INTRAVENOUS

## 2023-08-26 MED ORDER — LIDOCAINE-EPINEPHRINE 1 %-1:100000 IJ SOLN
INTRAMUSCULAR | Status: DC | PRN
  Administered 2023-08-26: 5 mL

## 2023-08-26 MED ORDER — ROPIVACAINE HCL 5 MG/ML IJ SOLN
INTRAMUSCULAR | Status: AC
  Filled 2023-08-26: qty 20

## 2023-08-26 SURGICAL SUPPLY — 28 items
BIT DRILL 2 FAST STEP (BIT) IMPLANT
BIT DRILL 2.5X4 QC (BIT) IMPLANT
BNDG ELASTIC 3X5.8 VLCR NS LF (GAUZE/BANDAGES/DRESSINGS) ×1 IMPLANT
CANISTER SUCT 1200ML W/VALVE (MISCELLANEOUS) ×1 IMPLANT
CHLORAPREP W/TINT 26 (MISCELLANEOUS) ×1 IMPLANT
COVER LIGHT HANDLE UNIVERSAL (MISCELLANEOUS) ×2 IMPLANT
DRAPE FLUOR MINI C-ARM 54X84 (DRAPES) ×1 IMPLANT
ELECTRODE REM PT RTRN 9FT ADLT (ELECTROSURGICAL) ×1 IMPLANT
GAUZE SPONGE 4X4 12PLY STRL (GAUZE/BANDAGES/DRESSINGS) ×1 IMPLANT
GAUZE XEROFORM 1X8 LF (GAUZE/BANDAGES/DRESSINGS) ×1 IMPLANT
GLOVE SURG SYN 9.0 PF PI (GLOVE) ×1 IMPLANT
GOWN STRL REIN 2XL XLG LVL4 (GOWN DISPOSABLE) ×1 IMPLANT
GOWN STRL REUS W/ TWL LRG LVL3 (GOWN DISPOSABLE) ×1 IMPLANT
KIT TURNOVER KIT A (KITS) ×1 IMPLANT
KWIRE FX5X1.6XNS BN SS (WIRE) IMPLANT
NS IRRIG 500ML POUR BTL (IV SOLUTION) ×1 IMPLANT
PACK EXTREMITY ARMC (MISCELLANEOUS) ×1 IMPLANT
PADDING CAST BLEND 3X4 STRL (MISCELLANEOUS) ×2 IMPLANT
PEG SUBCHONDRAL SMOOTH 2.0X14 (Peg) IMPLANT
PEG SUBCHONDRAL SMOOTH 2.0X16 (Peg) IMPLANT
PEG SUBCHONDRAL SMOOTH 2.0X18 (Peg) IMPLANT
PEG SUBCHONDRAL SMOOTH 2.0X20 (Peg) IMPLANT
PENCIL SMOKE EVACUATOR (MISCELLANEOUS) ×1 IMPLANT
PLATE SHORT 21.6X48.9 NRRW LT (Plate) IMPLANT
SCREW CORT 3.5X10 LNG (Screw) IMPLANT
SPLINT CAST 1 STEP 3X12 (MISCELLANEOUS) ×1 IMPLANT
SUT VICRYL 3-0 27IN (SUTURE) ×1 IMPLANT
SUTURE ETHLN 4-0 FS2 18XMF BLK (SUTURE) ×1 IMPLANT

## 2023-08-26 NOTE — Discharge Instructions (Signed)
 Keep arm elevated is much as possible through the weekend Move your fingers all you can Keep dressing clean and dry Pain medicine as previously prescribed Call office if you are having problems, we have an answering service to call our office direct and you will get in touch with our PA on-call  [phone] Numbing medicine was put in your arm so your arm may be numb for 6 to 8 hours and there is nothing to be concerned about

## 2023-08-26 NOTE — Transfer of Care (Signed)
 Immediate Anesthesia Transfer of Care Note  Patient: Paige Griffin  Procedure(s) Performed: OPEN REDUCTION INTERNAL FIXATION (ORIF) DISTAL RADIUS FRACTURE (Left: Wrist)  Patient Location: PACU  Anesthesia Type: General  Level of Consciousness: awake, alert  and patient cooperative  Airway and Oxygen Therapy: Patient Spontanous Breathing and Patient connected to supplemental oxygen  Post-op Assessment: Post-op Vital signs reviewed, Patient's Cardiovascular Status Stable, Respiratory Function Stable, Patent Airway and No signs of Nausea or vomiting  Post-op Vital Signs: Reviewed and stable  Complications: No notable events documented.

## 2023-08-26 NOTE — Anesthesia Procedure Notes (Signed)
 Anesthesia Regional Block: Axillary brachial plexus block   Pre-Anesthetic Checklist: , timeout performed,  Correct Patient, Correct Site, Correct Laterality,  Correct Procedure, Correct Position, site marked,  Risks and benefits discussed,  Surgical consent,  Pre-op evaluation,  At surgeon's request and post-op pain management  Laterality: Upper and Left  Prep: chloraprep       Needles:   Needle Type: Stimiplex     Needle Length: 9cm  Needle Gauge: 20     Additional Needles:   Procedures:,,,, ultrasound used (permanent image in chart),,    Narrative:  Start time: 08/26/2023 12:29 PM End time: 08/26/2023 12:36 PM  Performed by: Personally  Anesthesiologist: Ola Donny BROCKS, MD  Additional Notes: Patient positioned with operative arm (block arm) at 90 degrees, elbow flexed, tolerated procedure well.

## 2023-08-26 NOTE — Anesthesia Postprocedure Evaluation (Signed)
 Anesthesia Post Note  Patient: Paige Griffin  Procedure(s) Performed: OPEN REDUCTION INTERNAL FIXATION (ORIF) DISTAL RADIUS FRACTURE (Left: Wrist)  Patient location during evaluation: PACU Anesthesia Type: General Level of consciousness: awake and alert Pain management: pain level controlled Vital Signs Assessment: post-procedure vital signs reviewed and stable Respiratory status: spontaneous breathing, nonlabored ventilation, respiratory function stable and patient connected to nasal cannula oxygen Cardiovascular status: blood pressure returned to baseline and stable Postop Assessment: no apparent nausea or vomiting Anesthetic complications: no Comments: Patient not in pain postop, but concerned about ability to care for her elderly husband, for whom she is sole caretaker and he has dementia.    No notable events documented.   Last Vitals:  Vitals:   08/26/23 1400 08/26/23 1410  BP: (!) 140/94   Pulse: 69 67  Resp: 17 12  Temp:    SpO2: 100% 99%    Last Pain:  Vitals:   08/26/23 1354  TempSrc:   PainSc: Asleep                 Paige Griffin
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
## 2023-08-26 NOTE — Op Note (Signed)
 08/26/2023  1:50 PM  PATIENT:  Paige Griffin  78 y.o. female  PRE-OPERATIVE DIAGNOSIS:  Closed Colles' fracture of left radius, initial encounter S52.532A Acute pain of left wrist M25.532  POST-OPERATIVE DIAGNOSIS:  Closed Colles' fracture of left radius  PROCEDURE:  Procedure(s): OPEN REDUCTION INTERNAL FIXATION (ORIF) DISTAL RADIUS FRACTURE (Left)  SURGEON: Ozell JINNY Flake, MD  ASSISTANTS: None  ANESTHESIA:   general  EBL:  Total I/O In: 600 [I.V.:600] Out: -   BLOOD ADMINISTERED:none  DRAINS: none   LOCAL MEDICATIONS USED:  MARCAINE   , Amount: 10 ml, and OTHER axillary block attempted with anesthesia prior to surgery uncertain if this was effective  SPECIMEN:  No Specimen  DISPOSITION OF SPECIMEN:  N/A  COUNTS:  YES  TOURNIQUET:   Total Tourniquet Time Documented: Upper Arm (Left) - 29 minutes Total: Upper Arm (Left) - 29 minutes   IMPLANTS: Short narrow left DVR plate with 3 cortical screws and multiple smooth pegs  DICTATION: .Dragon Dictation patient was brought to the operating room and after the arm was prepped and draped in the usual sterile fashion after an axillary l block placed by anesthesia before coming back to the OR was obtained.   Tourniquet having been applied to the upper arm.  Appropriate patient identification and timeout procedures were completed.  Tourniquet was raised and a volar approach was made after traction was applied at the end of the table through fingertrap traction.  Approach is center of the FCR tendon.  Tendon sheath incised and the tendon retracted radially protect the radial artery and associated veins.  The deep fascia was incised and the pronator was elevated off the distal and proximal fragments with exposure of the fracture site.  There was partial healing and the fracture site was exposed on both sides with early callus removed.  Freer elevator used to separate the scar tissue down deep along with use of rongeurs to remove this  tissue to allow for motion of the distal fragment.  The fracture was extra articular with multiple interposed tissue  fragments and with traction applied and Freer elevator adequate alignment was obtained.  A short narrow DVR plate applied with distal first technique using a K wire to hold the plate in position position of the plate was checked in both AP and lateral projections.  When acceptable position was obtained the smooth pegs were placed drilling measuring and placing the smooth pegs in the distal fragments  following this the shaft portion of the plate was brought down to the bone and 3  cortical screws were sequentially inserted they gave an essentially anatomic alignment in both AP and lateral projections.  Traction was removed and under fluoroscopic views there is no motion of the fracture.   The wound irrigated with tourniquet let down.  Wound was then closed with 3-0 Vicryl for the deep tissue and 4-0 nylon in a simple interrupted manner..  Dressing of 4 x 4's web roll and volar splint followed by Ace wrap applied.   PLAN OF CARE: Discharge to home after PACU  PATIENT DISPOSITION:  PACU - hemodynamically stable.

## 2023-08-29 ENCOUNTER — Encounter: Payer: Self-pay | Admitting: Orthopedic Surgery

## 2023-10-24 ENCOUNTER — Ambulatory Visit
Admission: RE | Admit: 2023-10-24 | Discharge: 2023-10-24 | Disposition: A | Discharge status: Home / Self Care | Source: Ambulatory Visit | Attending: Gerontology | Admitting: Gerontology

## 2023-10-24 DIAGNOSIS — Z1231 Encounter for screening mammogram for malignant neoplasm of breast: Secondary | ICD-10-CM | POA: Insufficient documentation

## 2023-11-03 ENCOUNTER — Ambulatory Visit (INDEPENDENT_AMBULATORY_CARE_PROVIDER_SITE_OTHER): Admitting: Pulmonary Disease

## 2023-11-03 ENCOUNTER — Ambulatory Visit: Admitting: Pulmonary Disease

## 2023-11-03 ENCOUNTER — Encounter: Payer: Self-pay | Admitting: Pulmonary Disease

## 2023-11-03 VITALS — BP 140/80 | HR 78 | Temp 97.6°F | Ht 64.0 in | Wt 115.0 lb

## 2023-11-03 DIAGNOSIS — B441 Other pulmonary aspergillosis: Secondary | ICD-10-CM

## 2023-11-03 DIAGNOSIS — J439 Emphysema, unspecified: Secondary | ICD-10-CM

## 2023-11-03 DIAGNOSIS — Z01811 Encounter for preprocedural respiratory examination: Secondary | ICD-10-CM | POA: Diagnosis not present

## 2023-11-03 DIAGNOSIS — Z87891 Personal history of nicotine dependence: Secondary | ICD-10-CM | POA: Diagnosis not present

## 2023-11-03 LAB — PULMONARY FUNCTION TEST
FEF 25-75 Post: 1.52 L/s
FEF 25-75 Pre: 1.51 L/s
FEF2575-%Change-Post: 0 %
FEF2575-%Pred-Post: 94 %
FEF2575-%Pred-Pre: 93 %
FEV1-%Change-Post: 1 %
FEV1-%Pred-Post: 86 %
FEV1-%Pred-Pre: 84 %
FEV1-Post: 1.79 L
FEV1-Pre: 1.75 L
FEV1FVC-%Change-Post: 7 %
FEV1FVC-%Pred-Pre: 102 %
FEV6-%Change-Post: -2 %
FEV6-%Pred-Post: 83 %
FEV6-%Pred-Pre: 85 %
FEV6-Post: 2.19 L
FEV6-Pre: 2.25 L
FEV6FVC-%Pred-Post: 105 %
FEV6FVC-%Pred-Pre: 105 %
FVC-%Change-Post: -4 %
FVC-%Pred-Post: 78 %
FVC-%Pred-Pre: 82 %
FVC-Post: 2.19 L
FVC-Pre: 2.3 L
Post FEV1/FVC ratio: 82 %
Post FEV6/FVC ratio: 100 %
Pre FEV1/FVC ratio: 76 %
Pre FEV6/FVC Ratio: 100 %
RV % pred: 116 %
RV: 2.7 L
TLC % pred: 93 %
TLC: 4.71 L

## 2023-11-03 NOTE — Patient Instructions (Signed)
 Full PFT completed today ? ?

## 2023-11-03 NOTE — Progress Notes (Signed)
 Subjective:    Patient ID: Paige Griffin, female    DOB: 07-31-1945, 78 y.o.   MRN: [id number]  Patient Care Team: Steva Clotilda DEL, NP as PCP - General (Family Medicine)  Chief Complaint  Patient presents with   Medical Management of Chronic Issues    BACKGROUND/INTERVAL:Patient is a 78 year old lifelong former light cigarette smoker who presents for follow-up of a right upper lobe lung nodule noted on CT chest performed 04 January 2023 during admission at [facility] for acute right basal ganglia hemorrhage. This was due to hypertensive urgency. Neurology recommended waiting at least 2 weeks before procedures are contemplated.  I initially saw the patient on 12 January 2023 and a PET/CT was ordered.  She had a PET/CT performed on 27 January 2023, this showed low-level of FDG uptake on the right upper lobe pulmonary nodule cannot determine whether this may be infectious versus malignant.  Bronchoscopy recommended.  Patient scheduled for robotic assisted bronchoscopy on 25 February 2023 however anesthesia felt uneasy about inducing the patient due to the history of recent basal ganglia hemorrhage.  Recall patient had been cleared by neurology.  Patient had robotic assisted navigational bronchoscopy on 04 July 2023.  She missed follow-up appointment after that.  Bronchoscopy was consistent with aspergilloma of the right upper lobe.  Patient had PFTs today.  HPI Discussed the use of AI scribe software for clinical note transcription with the patient, who gave verbal consent to proceed.  History of Present Illness   Paige Griffin is a 78 year old female with aspergilloma in the right upper lobe who presents for follow-up.  She presents with her husband, Vonn, who does not participate in the conversation due to dementia.  She is asymptomatic with no issues related to breathing and no cough. She has a history of emphysema. She is concerned about the potential growth of the aspergilloma.  She  understands that resection is recommended however currently she is the only caretaker for her husband who has dementia.  She is concerned about downtime after surgery. No fever, chills, or changes in breathing. She is scheduled to receive her flu shot next week.     PFTs were performed today they were consistent with very minimal obstructive airways disease.  FEV1 was 1.75 L or 84%, FVC 2.30 L or 82% predicted, FEV1/FVC 76%, no bronchodilator response.  Lung volumes were normal.  Diffusion capacity normal.  Review of Systems A 10 point review of systems was performed and it is as noted above otherwise negative.   Patient Active Problem List   Diagnosis Date Noted   Grade I diastolic dysfunction 08/23/2023   Mild mitral regurgitation by prior echocardiogram 08/23/2023   Mild aortic regurgitation 08/23/2023   UTI (urinary tract infection) 01/06/2023   Hemorrhagic stroke (HCC) 01/04/2023   HTN (hypertension) 01/04/2023   Lung nodule 01/04/2023    Social History   Tobacco Use   Smoking status: Former    Current packs/day: 0.00    Types: Cigarettes    Quit date: 2020    Years since quitting: 5.7   Smokeless tobacco: Never   Tobacco comments:    Smoked 2-3 cigarettes daily  Substance Use Topics   Alcohol use: Never    No Known Allergies  Current Meds  Medication Sig   acetaminophen  (TYLENOL ) 325 MG tablet Take 2 tablets (650 mg total) by mouth every 4 (four) hours as needed for mild pain (pain score 1-3) (or temp > 37.5 C (99.5 F)).  Cholecalciferol 50 MCG (2000 UT) CAPS Take by mouth.   cyanocobalamin 1000 MCG tablet Take 2 tablets daily for 2 weeks, then reduce to 1 tablet daily thereafter for Vitamin B12 Deficiency.   losartan  (COZAAR ) 25 MG tablet Take 25 mg by mouth at bedtime.   Omega-3 Fatty Acids (FISH OIL) 300 MG CAPS Take 1 capsule by mouth 1 day or 1 dose.   pravastatin  (PRAVACHOL ) 20 MG tablet Take 20 mg by mouth at bedtime.   zoledronic acid (RECLAST) 5 MG/100ML  SOLN injection Inject 5 mg into the vein once.    Immunization History  Administered Date(s) Administered   Influenza,inj,quad, With Preservative 11/08/2016, 11/07/2017   Influenza-Unspecified 11/02/2018, 11/03/2020, 11/09/2021, 11/02/2022   PFIZER Comirnaty(Gray Top)Covid-19 Tri-Sucrose Vaccine 04/04/2019, 04/25/2019, 11/08/2019, 06/16/2020   Pfizer Covid-19 Vaccine Bivalent Booster 27yrs & up 01/05/2021   Pneumococcal Conjugate-13 12/15/2016   Pneumococcal Polysaccharide-23 12/25/2013   Respiratory Syncytial Virus Vaccine,Recomb Aduvanted(Arexvy) 01/13/2022   Tdap 12/25/2013   Unspecified SARS-COV-2 Vaccination 12/16/2021, 11/08/2022   Zoster Recombinant(Shingrix) 07/02/2021, 08/02/2022   Zoster, Live 11/08/2012        Objective:     BP (!) 140/80   Pulse 78   Temp 97.6 F (36.4 C) (Temporal)   Ht 5' 4 (1.626 m)   Wt 115 lb (52.2 kg)   SpO2 98%   BMI 19.74 kg/m   SpO2: 98 %  GENERAL: Well-developed, thin woman, no acute distress, fully ambulatory.  No conversational dyspnea HEAD: Normocephalic, atraumatic.  EYES: Pupils equal, round, reactive to light.  No scleral icterus.  MOUTH: Pharynx clear, no exudate.  Mucous membranes moist. NECK: Supple. No thyromegaly. Trachea midline. No JVD.  No adenopathy. PULMONARY: Good air entry bilaterally.  No adventitious sounds. CARDIOVASCULAR: S1 and S2. Regular rate and rhythm.  No rubs, murmurs or gallops heard. ABDOMEN: Benign. MUSCULOSKELETAL: No joint deformity, no clubbing, no edema.  NEUROLOGIC: No overt focal deficit, no gait disturbance, speech is fluent. SKIN: Intact,warm,dry. PSYCH: Mood and behavior normal  Recent Results (from the past 2160 hours)  Pulmonary function test     Status: None   Collection Time: 11/03/23  9:41 AM  Result Value Ref Range   FVC-Pre 2.30 L   FVC-%Pred-Pre 82 %   FVC-Post 2.19 L   FVC-%Pred-Post 78 %   FVC-%Change-Post -4 %   FEV1-Pre 1.75 L   FEV1-%Pred-Pre 84 %   FEV1-Post 1.79 L    FEV1-%Pred-Post 86 %   FEV1-%Change-Post 1 %   FEV6-Pre 2.25 L   FEV6-%Pred-Pre 85 %   FEV6-Post 2.19 L   FEV6-%Pred-Post 83 %   FEV6-%Change-Post -2 %   Pre FEV1/FVC ratio 76 %   FEV1FVC-%Pred-Pre 102 %   Post FEV1/FVC ratio 82 %   FEV1FVC-%Change-Post 7 %   Pre FEV6/FVC Ratio 100 %   FEV6FVC-%Pred-Pre 105 %   Post FEV6/FVC ratio 100 %   FEV6FVC-%Pred-Post 105 %   FEF 25-75 Pre 1.51 L/sec   FEF2575-%Pred-Pre 93 %   FEF 25-75 Post 1.52 L/sec   FEF2575-%Pred-Post 94 %   FEF2575-%Change-Post 0 %   RV 2.70 L   RV % pred 116 %   TLC 4.71 L   TLC % pred 93 %  *PFTs discussed with patient.   Assessment & Plan:     ICD-10-CM   1. Pulmonary aspergilloma (HCC)  B44.1 CT CHEST WO CONTRAST    2. Pulmonary emphysema (HCC) - mild  J43.9       Orders Placed This Encounter  Procedures  CT CHEST WO CONTRAST    Standing Status:   Future    Expected Date:   12/27/2023    Expiration Date:   11/02/2024    Preferred imaging location?:   [facility]   Discussion:    Right upper lobe aspergilloma Aspergilloma located in the right upper lobe with no current symptoms such as cough or breathing difficulties. Lung function tests are good. Surgical removal may be required due to potential growth. - Consider referral to thoracic surgery for evaluation of aspergilloma removal - Order follow-up scan in late November to assess aspergilloma - Instruct her to report any fever, chills, cough, or changes in breathing  Emphysema Mild emphysema on imaging with good lung function and no significant impairment.      Advised if symptoms do not improve or worsen, to please contact office for sooner follow up or seek emergency care.    I spent 30 minutes of dedicated to the care of this patient on the date of this encounter to include pre-visit review of records, face-to-face time with the patient discussing conditions above, post visit ordering of testing, clinical documentation with the  electronic health record, making appropriate referrals as documented, and communicating necessary findings to members of the patients care team.     C. Leita Sanders, MD Advanced Bronchoscopy PCCM [facility]    *This note was generated using voice recognition software/Dragon and/or AI transcription program.  Despite best efforts to proofread, errors can occur which can change the meaning. Any transcriptional errors that result from this process are unintentional and may not be fully corrected at the time of dictation.

## 2023-11-03 NOTE — Patient Instructions (Signed)
 VISIT SUMMARY:  Today, we discussed your current health status, focusing on your aspergilloma and emphysema. You are currently asymptomatic with no breathing issues or cough. We also talked about the potential growth of the aspergilloma and the next steps for its management.  YOUR PLAN:  -RIGHT UPPER LOBE ASPERGILLOMA: An aspergilloma is a fungal infection that forms a mass in the lungs. Your aspergilloma is located in the right upper lobe and is currently not causing any symptoms. However, due to the potential for growth, we recommend referral to a thoracic surgeon in [place] for evaluation of possible removal.  However, he would like to hold off on this for now.  Additionally, we will order a follow-up scan in late November to monitor the aspergilloma. Please report any fever, chills, cough, or changes in breathing immediately.  -EMPHYSEMA: Emphysema is a lung condition that causes shortness of breath. Your imaging shows mild emphysema, but your lung function is good and there is no significant impairment at this time.  INSTRUCTIONS:  We will schedule a follow-up scan in late November to assess the aspergilloma. Report any fever, chills, cough, or changes in breathing immediately. You are scheduled to receive your flu shot next week.

## 2023-11-03 NOTE — Progress Notes (Signed)
 Full PFT completed today ? ?

## 2023-11-07 ENCOUNTER — Encounter: Payer: Self-pay | Admitting: Pulmonary Disease

## 2023-12-26 ENCOUNTER — Ambulatory Visit
Admission: RE | Admit: 2023-12-26 | Discharge: 2023-12-26 | Disposition: A | Discharge status: Home / Self Care | Source: Ambulatory Visit | Attending: Pulmonary Disease | Admitting: Pulmonary Disease

## 2023-12-26 DIAGNOSIS — B441 Other pulmonary aspergillosis: Secondary | ICD-10-CM | POA: Diagnosis present

## 2024-01-11 ENCOUNTER — Ambulatory Visit: Payer: Self-pay | Admitting: Pulmonary Disease

## 2024-01-11 NOTE — Progress Notes (Signed)
 1st attempt -- called and LVMTCB

## 2024-01-11 NOTE — Progress Notes (Signed)
 Spoke with pt and NFN.
